# Patient Record
Sex: Male | Born: 1960 | Race: White | Hispanic: No | Marital: Married | Smoking: Never smoker
Health system: Southern US, Community
[De-identification: ages and names within clinical notes are randomized; demographics above are authoritative.]

## PROBLEM LIST (undated history)

## (undated) DIAGNOSIS — I10 Essential (primary) hypertension: Secondary | ICD-10-CM

## (undated) DIAGNOSIS — M1990 Unspecified osteoarthritis, unspecified site: Secondary | ICD-10-CM

## (undated) DIAGNOSIS — R079 Chest pain, unspecified: Secondary | ICD-10-CM

## (undated) DIAGNOSIS — E785 Hyperlipidemia, unspecified: Secondary | ICD-10-CM

## (undated) DIAGNOSIS — F419 Anxiety disorder, unspecified: Secondary | ICD-10-CM

## (undated) HISTORY — DX: Hyperlipidemia, unspecified: E78.5

## (undated) HISTORY — DX: Anxiety disorder, unspecified: F41.9

## (undated) HISTORY — PX: OTHER SURGICAL HISTORY: SHX169

## (undated) HISTORY — DX: Essential (primary) hypertension: I10

## (undated) HISTORY — DX: Chest pain, unspecified: R07.9

---

## 2006-07-12 ENCOUNTER — Ambulatory Visit: Payer: Self-pay | Admitting: Family Medicine

## 2006-08-14 ENCOUNTER — Ambulatory Visit: Payer: Self-pay | Admitting: Family Medicine

## 2014-08-11 NOTE — H&P (Signed)
  NTS SOAP Note  Vital Signs:  Vitals as of: 10/02/6965: Systolic 893: Diastolic 86: Heart Rate 56: Temp 98.93F: Height 8ft 11in: Weight 198Lbs 0 Ounces: BMI 27.62  BMI : 27.62 kg/m2  Subjective: This 54 year old male presents for of need for screening TCS.  Never has had one.  No gi complaints.  No family h/o colon cancer.  Review of Symptoms:  Constitutional:unremarkable   Head:unremarkable Eyes:blurred vision bilateral sinus problems Cardiovascular:  unremarkable Respiratory:unremarkable Gastrointestinal:  unremarkable   Genitourinary:unremarkable   back pain Skin:unremarkable Hematolgic/Lymphatic:unremarkable   Allergic/Immunologic:unremarkable   Past Medical History:  Reviewed  Past Medical History  Surgical History: right hand surgery Medical Problems: HTN Allergies: alaskan pollack Medications: alprazolam,  lisinopril   Social History:Reviewed  Social History  Preferred Language: English Race:  White Ethnicity: Not Hispanic / Latino Age: 40 year Marital Status:  L Alcohol: socially   Smoking Status: Never smoker reviewed on 07/25/2014 Functional Status reviewed on 07/25/2014 ------------------------------------------------ Bathing: Normal Cooking: Normal Dressing: Normal Driving: Normal Eating: Normal Managing Meds: Normal Oral Care: Normal Shopping: Normal Toileting: Normal Transferring: Normal Walking: Normal Cognitive Status reviewed on 07/25/2014 ------------------------------------------------ Attention: Normal Decision Making: Normal Language: Normal Memory: Normal Motor: Normal Perception: Normal Problem Solving: Normal Visual and Spatial: Normal   Family History:Reviewed  Family Health History Mother, Living; Stroke (CVA);  Father, Living; Chronic obstructive lung disease (COPD);     Objective Information: General:Well appearing, well nourished in no distress. Heart:RRR, no murmur Lungs:  CTA  bilaterally, no wheezes, rhonchi, rales.  Breathing unlabored. Abdomen:Soft, NT/ND, no HSM, no masses. deferred to procedure  Assessment:Need for screening TCS  Diagnoses: 553.21  K43.2 Recurrent ventral incisional hernia (Incisional hernia without obstruction or gangrene)  Procedures: 81017 - OFFICE OUTPATIENT NEW 20 MINUTES    Plan:  Scheduled for TCS on 09/09/14.   Patient Education:Alternative treatments to surgery were discussed with patient (and family).  Risks and benefits  of procedure including bleeding and perforation were fully explained to the patient (and family) who gave informed consent. Patient/family questions were addressed.  Follow-up:Pending Surgery

## 2014-09-09 ENCOUNTER — Ambulatory Visit (HOSPITAL_COMMUNITY)
Admission: RE | Admit: 2014-09-09 | Discharge: 2014-09-09 | Disposition: A | Discharge status: Home / Self Care | Payer: BLUE CROSS/BLUE SHIELD | Source: Ambulatory Visit | Attending: General Surgery | Admitting: General Surgery

## 2014-09-09 ENCOUNTER — Encounter (HOSPITAL_COMMUNITY)
Admission: RE | Disposition: A | Discharge status: Home / Self Care | Payer: Self-pay | Source: Ambulatory Visit | Attending: General Surgery

## 2014-09-09 DIAGNOSIS — K432 Incisional hernia without obstruction or gangrene: Secondary | ICD-10-CM | POA: Insufficient documentation

## 2014-09-09 DIAGNOSIS — I10 Essential (primary) hypertension: Secondary | ICD-10-CM | POA: Insufficient documentation

## 2014-09-09 DIAGNOSIS — K620 Anal polyp: Secondary | ICD-10-CM | POA: Diagnosis not present

## 2014-09-09 DIAGNOSIS — Z1211 Encounter for screening for malignant neoplasm of colon: Secondary | ICD-10-CM | POA: Diagnosis not present

## 2014-09-09 DIAGNOSIS — D125 Benign neoplasm of sigmoid colon: Secondary | ICD-10-CM | POA: Diagnosis not present

## 2014-09-09 DIAGNOSIS — Z79899 Other long term (current) drug therapy: Secondary | ICD-10-CM | POA: Diagnosis not present

## 2014-09-09 HISTORY — PX: COLONOSCOPY: SHX5424

## 2014-09-09 SURGERY — COLONOSCOPY
Anesthesia: Moderate Sedation

## 2014-09-09 MED ORDER — SODIUM CHLORIDE 0.9 % IV SOLN
INTRAVENOUS | Status: DC
  Administered 2014-09-09: 1000 mL via INTRAVENOUS

## 2014-09-09 MED ORDER — MEPERIDINE HCL 50 MG/ML IJ SOLN
INTRAMUSCULAR | Status: DC | PRN
  Administered 2014-09-09: 50 mg via INTRAVENOUS

## 2014-09-09 MED ORDER — MIDAZOLAM HCL 5 MG/5ML IJ SOLN
INTRAMUSCULAR | Status: DC | PRN
  Administered 2014-09-09: 3 mg via INTRAVENOUS
  Administered 2014-09-09: 1 mg via INTRAVENOUS

## 2014-09-09 MED ORDER — MIDAZOLAM HCL 5 MG/5ML IJ SOLN
INTRAMUSCULAR | Status: AC
  Filled 2014-09-09: qty 10

## 2014-09-09 MED ORDER — MEPERIDINE HCL 50 MG/ML IJ SOLN
INTRAMUSCULAR | Status: DC
Start: 2014-09-09 — End: 2014-09-09
  Filled 2014-09-09: qty 1

## 2014-09-09 MED ORDER — STERILE WATER FOR IRRIGATION IR SOLN
Status: DC | PRN
  Administered 2014-09-09: 09:00:00

## 2014-09-09 NOTE — Op Note (Signed)
[facility] [address] [place], [address]   COLONOSCOPY PROCEDURE REPORT     EXAM DATE: 2014/10/03  PATIENT NAME:      Carl Erickson, Carl Erickson           MR #:      [id number]  BIRTHDATE:       1960-08-21      VISIT #:     [id number]  ATTENDING:     Aviva Signs, MD     STATUS:     outpatient ASSISTANT:  INDICATIONS:  The patient is a 54 yr old male here for a colonoscopy due to average risk patient for colon cancer. PROCEDURE PERFORMED:     Colonoscopy with snare polypectomy MEDICATIONS:     Demerol 50 mg IV and Versed 5 mg IV ESTIMATED BLOOD LOSS:     None  CONSENT: The patient understands the risks and benefits of the procedure and understands that these risks include, but are not limited to: sedation, allergic reaction, infection, perforation and/or bleeding. Alternative means of evaluation and treatment include, among others: physical exam, x-rays, and/or surgical intervention. The patient elects to proceed with this endoscopic procedure.  DESCRIPTION OF PROCEDURE: During intra-op preparation period all mechanical & medical equipment was checked for proper function. Hand hygiene and appropriate measures for infection prevention was taken. After the risks, benefits and alternatives of the procedure were thoroughly explained, Informed consent was verified, confirmed and timeout was successfully executed by the treatment team. A digital exam revealed no abnormalities of the rectum. The EC-[id number] ([id number]) endoscope was introduced through the anus and advanced to the cecum, which was identified by both the appendix and ileocecal valve. adequate (Trilyte was used) The instrument was then slowly withdrawn as the colon was fully examined.Estimated blood loss is zero unless otherwise noted in this procedure report.   COLON FINDINGS: Two pedunculated polyps ranging between 3-18mm in size with friable surfaces were found in the distal sigmoid colon, 20 and 25cm  from the anus.  Polypectomies were performed using snare cautery.  The resection was complete, the polyp tissue was completely retrieved and sent to histology. Rest of colon otherwise unremarkable.  Retroflexed views revealed no abnormalities. The scope was then completely withdrawn from the patient and the procedure terminated. SCOPE WITHDRAWAL TIME: 11 min    ADVERSE EVENTS:      There were no immediate complications.  IMPRESSIONS:     Two pedunculated polyps ranging between 3-83mm in size were found in the distal sigmoid colon; polypectomies were performed using snare cautery  RECOMMENDATIONS:     1.  Await pathology results 2.  Repeat Colonoscopy in 3 years. RECALL:  _____________________________ Aviva Signs, MD eSigned:  Aviva Signs, MD Oct 03, 2014 8:57 AM   cc:   CPT CODES: ICD CODES:  The ICD and CPT codes recommended by this software are interpretations from the data that the clinical staff has captured with the software.  The verification of the translation of this report to the ICD and CPT codes and modifiers is the sole responsibility of the health care institution and practicing physician where this report was generated.  [employer] will not be held responsible for the validity of the ICD and CPT codes included on this report.  AMA assumes no liability for data contained or not contained herein. CPT is a Designer, television/film set of the [employer].   PATIENT NAME:  Carl Erickson, Carl Erickson MR#: [id number]

## 2014-09-09 NOTE — Discharge Instructions (Signed)

## 2014-09-09 NOTE — Interval H&P Note (Signed)
History and Physical Interval Note:  09/09/2014 8:32 AM  Carl Erickson  has presented today for surgery, with the diagnosis of screening  The various methods of treatment have been discussed with the patient and family. After consideration of risks, benefits and other options for treatment, the patient has consented to  Procedure(s): COLONOSCOPY (N/A) as a surgical intervention .  The patient's history has been reviewed, patient examined, no change in status, stable for surgery.  I have reviewed the patient's chart and labs.  Questions were answered to the patient's satisfaction.     Aviva Signs A

## 2014-09-10 ENCOUNTER — Encounter (HOSPITAL_COMMUNITY): Payer: Self-pay | Admitting: General Surgery

## 2016-06-23 ENCOUNTER — Ambulatory Visit (INDEPENDENT_AMBULATORY_CARE_PROVIDER_SITE_OTHER): Payer: BLUE CROSS/BLUE SHIELD | Admitting: Otolaryngology

## 2016-06-23 DIAGNOSIS — T783XXA Angioneurotic edema, initial encounter: Secondary | ICD-10-CM

## 2016-06-23 DIAGNOSIS — R220 Localized swelling, mass and lump, head: Secondary | ICD-10-CM | POA: Diagnosis not present

## 2016-06-23 DIAGNOSIS — R070 Pain in throat: Secondary | ICD-10-CM | POA: Diagnosis not present

## 2016-06-27 ENCOUNTER — Ambulatory Visit (INDEPENDENT_AMBULATORY_CARE_PROVIDER_SITE_OTHER): Payer: BLUE CROSS/BLUE SHIELD | Admitting: Otolaryngology

## 2016-06-27 DIAGNOSIS — T783XXA Angioneurotic edema, initial encounter: Secondary | ICD-10-CM | POA: Diagnosis not present

## 2017-07-06 ENCOUNTER — Ambulatory Visit (INDEPENDENT_AMBULATORY_CARE_PROVIDER_SITE_OTHER): Payer: Managed Care, Other (non HMO) | Admitting: Cardiovascular Disease

## 2017-07-06 ENCOUNTER — Encounter: Payer: Self-pay | Admitting: Cardiovascular Disease

## 2017-07-06 ENCOUNTER — Other Ambulatory Visit: Payer: Self-pay

## 2017-07-06 VITALS — BP 133/83 | HR 60 | Ht 71.0 in | Wt 196.0 lb

## 2017-07-06 DIAGNOSIS — F419 Anxiety disorder, unspecified: Secondary | ICD-10-CM

## 2017-07-06 DIAGNOSIS — I10 Essential (primary) hypertension: Secondary | ICD-10-CM | POA: Diagnosis not present

## 2017-07-06 DIAGNOSIS — R079 Chest pain, unspecified: Secondary | ICD-10-CM | POA: Diagnosis not present

## 2017-07-06 NOTE — Progress Notes (Signed)
CARDIOLOGY CONSULT NOTE  Patient ID: Carl Erickson MRN: [id number] DOB/AGE: April 14, 1960 57 y.o.  Admit date: (Not on file) Primary Physician: Ginger Organ Referring Physician: Ginger Organ  Reason for Consultation: Chest pain  HPI: Carl Erickson is a 57 y.o. male who is being seen today for the evaluation of chest pain at the request of Cory Munch, PA-C.   I reviewed records from his PCP.  Past medical history includes hypertension.  I personally reviewed an ECG performed on 06/08/17 which demonstrated normal sinus rhythm with no ischemic ST segment abnormalities, nor any arrhythmias.  There were nonspecific T wave abnormalities in leads III and aVF.  He said there was a lot of stress at work and things used to make him anxious and stressed out at work.  He experienced 15-20 minutes of retrosternal chest pain.  His coworkers had him sit down.  Symptoms resolved on their own.  He had some associated lightheadedness and dizziness.  He denies shortness of breath, palpitations, leg swelling, orthopnea, and syncope.  He went back to his PCP who increased amlodipine from 2.5 mg up to 5 mg.  He also placed him on fluoxetine 10 mg daily.  He is now feeling much better and denies chest pain altogether.  He never had any exertional symptoms prior to this.  This was an isolated instance.   Allergies  Allergen Reactions  . Ace Inhibitors Anaphylaxis    Current Outpatient Medications  Medication Sig Dispense Refill  . ALPRAZolam (XANAX) 1 MG tablet Take 1 mg by mouth at bedtime as needed for anxiety.    Marland Kitchen amLODipine (NORVASC) 5 MG tablet Take 5 mg by mouth daily.    Marland Kitchen FLUoxetine (PROZAC) 10 MG tablet Take 10 mg by mouth daily.    . Multiple Vitamins-Minerals (CENTRUM ULTRA MENS PO) Take 1 tablet by mouth daily.    Marland Kitchen aspirin EC 81 MG tablet Take 81 mg by mouth daily.     No current facility-administered medications for this visit.     Past Medical History:   Diagnosis Date  . Anxiety   . Chest pain   . Essential hypertension   . Hyperlipidemia     Past Surgical History:  Procedure Laterality Date  . COLONOSCOPY N/A 09/09/2014   Procedure: COLONOSCOPY;  Surgeon: Aviva Signs Md, MD;  Location: [facility];  Service: Gastroenterology;  Laterality: N/A;  . Repair on Right Hand      Social History   Socioeconomic History  . Marital status: Significant Other    Spouse name: Not on file  . Number of children: Not on file  . Years of education: Not on file  . Highest education level: Not on file  Occupational History  . Not on file  Social Needs  . Financial resource strain: Not on file  . Food insecurity:    Worry: Not on file    Inability: Not on file  . Transportation needs:    Medical: Not on file    Non-medical: Not on file  Tobacco Use  . Smoking status: Never Smoker  . Smokeless tobacco: Never Used  Substance and Sexual Activity  . Alcohol use: Not on file  . Drug use: Not on file  . Sexual activity: Not on file  Lifestyle  . Physical activity:    Days per week: Not on file    Minutes per session: Not on file  . Stress: Not on file  Relationships  .  Social connections:    Talks on phone: Not on file    Gets together: Not on file    Attends religious service: Not on file    Active member of club or organization: Not on file    Attends meetings of clubs or organizations: Not on file    Relationship status: Not on file  . Intimate partner violence:    Fear of current or ex partner: Not on file    Emotionally abused: Not on file    Physically abused: Not on file    Forced sexual activity: Not on file  Other Topics Concern  . Not on file  Social History Narrative  . Not on file     No family history of premature CAD in 1st degree relatives.  Current Meds  Medication Sig  . ALPRAZolam (XANAX) 1 MG tablet Take 1 mg by mouth at bedtime as needed for anxiety.  Marland Kitchen amLODipine (NORVASC) 5 MG tablet Take 5 mg by  mouth daily.  Marland Kitchen FLUoxetine (PROZAC) 10 MG tablet Take 10 mg by mouth daily.  . Multiple Vitamins-Minerals (CENTRUM ULTRA MENS PO) Take 1 tablet by mouth daily.      Review of systems complete and found to be negative unless listed above in HPI    Physical exam Blood pressure 133/83, pulse 60, height 5\' 11"  (1.803 m), weight 196 lb (88.9 kg), SpO2 97 %. General: NAD Neck: No JVD, no thyromegaly or thyroid nodule.  Lungs: Clear to auscultation bilaterally with normal respiratory effort. CV: Nondisplaced PMI. Regular rate and rhythm, normal S1/S2, no S3/S4, no murmur.  No peripheral edema.  No carotid bruit.  Abdomen: Soft, nontender, no distention.  Skin: Intact without lesions or rashes.  Neurologic: Alert and oriented x 3.  Psych: Normal affect. Extremities: No clubbing or cyanosis.  HEENT: Normal.   ECG: Most recent ECG reviewed.   Labs: No results found for: K, BUN, CREATININE, ALT, TSH, HGB   Lipids: No results found for: LDLCALC, LDLDIRECT, CHOL, TRIG, HDL      ASSESSMENT AND PLAN:  1.  Chest pain: This was an isolated instance and he has had no recurrence of symptoms.  He had no exertional chest discomfort or shortness of breath prior to this.  Symptoms may have been exacerbated by hypertension and anxiety and stress.  I told him if he were to have symptom recurrence in spite of good blood pressure control, I would obtain a stress test.  2.  Hypertension: Blood pressure is normal.  No changes to therapy.  3.  Anxiety and stress: Marked symptom medic improvement with fluoxetine as per patient and his wife.     Disposition: Follow up prn  Signed: Kate Sable, M.D., F.A.C.C.  07/06/2017, 1:49 PM

## 2017-07-06 NOTE — Patient Instructions (Signed)
Your physician recommends that you schedule a follow-up appointment in: AS NEEDED WITH DR KONESWARAN  Your physician recommends that you continue on your current medications as directed. Please refer to the Current Medication list given to you today.  Thank you for choosing [facility]!!    

## 2017-07-07 ENCOUNTER — Encounter: Payer: Self-pay | Admitting: *Deleted

## 2020-11-25 ENCOUNTER — Other Ambulatory Visit: Payer: Self-pay

## 2020-11-25 ENCOUNTER — Ambulatory Visit: Payer: Self-pay

## 2020-11-25 ENCOUNTER — Other Ambulatory Visit: Payer: Self-pay | Admitting: Occupational Medicine

## 2020-11-25 DIAGNOSIS — Z0000 Encounter for general adult medical examination without abnormal findings: Secondary | ICD-10-CM

## 2021-03-18 ENCOUNTER — Ambulatory Visit: Payer: 59

## 2021-03-18 ENCOUNTER — Ambulatory Visit: Payer: 59 | Admitting: Orthopedic Surgery

## 2021-03-18 ENCOUNTER — Other Ambulatory Visit: Payer: Self-pay

## 2021-03-18 VITALS — BP 133/83 | HR 65

## 2021-03-18 DIAGNOSIS — G8929 Other chronic pain: Secondary | ICD-10-CM

## 2021-03-18 DIAGNOSIS — S83242A Other tear of medial meniscus, current injury, left knee, initial encounter: Secondary | ICD-10-CM | POA: Diagnosis not present

## 2021-03-18 DIAGNOSIS — M25562 Pain in left knee: Secondary | ICD-10-CM

## 2021-03-18 DIAGNOSIS — M25561 Pain in right knee: Secondary | ICD-10-CM

## 2021-03-18 DIAGNOSIS — M170 Bilateral primary osteoarthritis of knee: Secondary | ICD-10-CM

## 2021-03-18 DIAGNOSIS — M2352 Chronic instability of knee, left knee: Secondary | ICD-10-CM | POA: Diagnosis not present

## 2021-03-18 MED ORDER — MELOXICAM 7.5 MG PO TABS: 7.5000 mg | ORAL_TABLET | Freq: Every day | ORAL | 5 refills | Status: DC

## 2021-03-18 NOTE — Patient Instructions (Signed)
While we are working on your approval for MRI please go ahead and call to schedule your appointment with [facility] within at least one (1) week.   Central Scheduling [phone]  

## 2021-03-18 NOTE — Progress Notes (Signed)
EVALUATION AND MANAGEMENT   Type of appointment : New patient evaluation  PLAN: Recommend MRI of the left knee  Start NSAIDs with meloxicam  Inject left knee  Procedure left knee injection  Patient gave consent for left knee injection and the site was confirmed with the patient by timeout  Medication Depo-Medrol 40 mg and lidocaine 1% 4 cc Skin prepped with alcohol and ethyl chloride Knee was injected with 20-gauge needle through a lateral approach with a flexed at 90 degrees No complications were seen  Meds ordered this encounter  Medications   meloxicam (MOBIC) 7.5 MG tablet    Sig: Take 1 tablet (7.5 mg total) by mouth daily.    Dispense:  30 tablet    Refill:  5     Chief Complaint  Patient presents with   Knee Pain    Bilat knee pain for approx 7 mos. Starter when he was playing golf and felt a pop in Lt knee. Lt > Rt. Instability in Lt knee going up stairs.    60 year old male works at [employer] injured his left knee in May of this year playing golf.  When he swung through as a right-handed golfer he felt a pop and pain on the medial joint line of his left knee  Since that time as he is dependent more of his right knee the left and right knee now hurt  He tried ibuprofen  He saw primary care  He is having a lot of difficulty because he walks a lot at work has to climb steps and his knee is not getting any better  His symptoms now include pain swelling popping difficulty walking   Review of Systems  All other systems reviewed and are negative.     Physical Exam Constitutional:      General: He is not in acute distress.    Appearance: He is well-developed.     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  Neurological:     Mental Status: He is alert and oriented to person, place, and time.     Sensory: No sensory deficit.     Coordination: Coordination normal.     Gait: Gait normal.     Deep Tendon Reflexes: Reflexes are normal and symmetric.  Psychiatric:        Mood and Affect: Mood normal.        Behavior: Behavior normal.        Thought Content: Thought content normal.        Judgment: Judgment normal.     Comments: Affect normal     Past Medical History:  Diagnosis Date   Anxiety    Chest pain    Essential hypertension    Hyperlipidemia    Past Surgical History:  Procedure Laterality Date   COLONOSCOPY N/A 09/09/2014   Procedure: COLONOSCOPY;  Surgeon: Aviva Signs Md, MD;  Location: [facility];  Service: Gastroenterology;  Laterality: N/A;   Repair on Right Hand     Social History   Tobacco Use   Smoking status: Never   Smokeless tobacco: Never     Assessment and Plan:  Encounter Diagnoses  Name Primary?   Bilateral chronic knee pain Yes   Recurrent left knee instability    Acute medial meniscus tear of left knee, initial encounter  Chief Complaint  Patient presents with   Knee Pain    Bilat knee pain for approx 7 mos. Starter when he was playing golf and felt a pop in Lt knee. Lt > Rt. Instability in Lt knee going up stairs.

## 2021-04-06 ENCOUNTER — Other Ambulatory Visit: Payer: Self-pay

## 2021-04-06 ENCOUNTER — Ambulatory Visit (HOSPITAL_COMMUNITY)
Admission: RE | Admit: 2021-04-06 | Discharge: 2021-04-06 | Disposition: A | Discharge status: Home / Self Care | Payer: 59 | Source: Ambulatory Visit | Attending: Orthopedic Surgery | Admitting: Orthopedic Surgery

## 2021-04-06 DIAGNOSIS — M25561 Pain in right knee: Secondary | ICD-10-CM | POA: Diagnosis not present

## 2021-04-06 DIAGNOSIS — M25562 Pain in left knee: Secondary | ICD-10-CM | POA: Insufficient documentation

## 2021-04-06 DIAGNOSIS — M2352 Chronic instability of knee, left knee: Secondary | ICD-10-CM | POA: Insufficient documentation

## 2021-04-06 DIAGNOSIS — G8929 Other chronic pain: Secondary | ICD-10-CM | POA: Insufficient documentation

## 2021-04-08 ENCOUNTER — Other Ambulatory Visit: Payer: Self-pay

## 2021-04-08 ENCOUNTER — Ambulatory Visit: Payer: 59 | Admitting: Orthopedic Surgery

## 2021-04-08 DIAGNOSIS — M1712 Unilateral primary osteoarthritis, left knee: Secondary | ICD-10-CM

## 2021-04-08 DIAGNOSIS — M2352 Chronic instability of knee, left knee: Secondary | ICD-10-CM | POA: Diagnosis not present

## 2021-04-08 DIAGNOSIS — S83242D Other tear of medial meniscus, current injury, left knee, subsequent encounter: Secondary | ICD-10-CM

## 2021-04-08 DIAGNOSIS — G8929 Other chronic pain: Secondary | ICD-10-CM

## 2021-04-08 DIAGNOSIS — M1710 Unilateral primary osteoarthritis, unspecified knee: Secondary | ICD-10-CM

## 2021-04-08 DIAGNOSIS — M25561 Pain in right knee: Secondary | ICD-10-CM | POA: Diagnosis not present

## 2021-04-08 NOTE — Progress Notes (Signed)
Chief Complaint  Patient presents with   Follow-up    MRI of left knee    Encounter Diagnoses  Name Primary?   Bilateral chronic knee pain    Recurrent left knee instability    Acute medial meniscus tear of left knee, subsequent encounter Yes   Primary localized osteoarthritis of knee     Carl Erickson is here for MRI follow-up.  His MRI shows a torn medial meniscus and then grade 4 articular cartilage defects in the patellofemoral joint and medial femoral condyle  I have personally reviewed the MRI and my interpretation is that he has a torn medial meniscus with complex tear and he has grade 4 arthritis in the medial and patellofemoral joint  Time he says the brace did help but it slowed him down.  He has to climb a lot of stairs at work for [employer]  This will factor majorly into his decision making for total knee versus arthroscopy  He did receive meloxicam and an injection in the left knee  We discussed the benefits and risks of total knee versus arthroscopy  My recommendations were that if he got the arthroscopy done the medial meniscectomy would improve his knee some but he has a 60% chance of having a knee replacement within 5 years and I estimated he would be out of work for 4 weeks  If he got the knee replacement done of course the complications are much higher including pulmonary embolism and infection and 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

## 2021-04-12 ENCOUNTER — Telehealth: Payer: Self-pay | Admitting: Orthopedic Surgery

## 2021-04-12 NOTE — Telephone Encounter (Signed)
Patient called to relay that he would like to procedd with total knee replacement surgery; states looking at dates 05/17/21 or 05/25/21. Pleae call [phone] (states has same [facility] insurance)

## 2021-04-13 ENCOUNTER — Other Ambulatory Visit: Payer: Self-pay | Admitting: Orthopedic Surgery

## 2021-04-13 DIAGNOSIS — M1712 Unilateral primary osteoarthritis, left knee: Secondary | ICD-10-CM

## 2021-04-13 DIAGNOSIS — Z01818 Encounter for other preprocedural examination: Secondary | ICD-10-CM

## 2021-04-13 MED ORDER — BUPIVACAINE-MELOXICAM ER 200-6 MG/7ML IJ SOLN: 400.0000 mg | Freq: Once | INTRAMUSCULAR | Status: AC

## 2021-04-13 NOTE — Telephone Encounter (Signed)
Called patient advised him can do Feb 21st told him to expect call from [facility] regarding pre op and I will mail him an order for walker  To you FYI he has chosen to proceed with the total knee replacement

## 2021-05-11 ENCOUNTER — Telehealth: Payer: Self-pay | Admitting: Radiology

## 2021-05-11 ENCOUNTER — Other Ambulatory Visit: Payer: Self-pay | Admitting: Orthopedic Surgery

## 2021-05-11 ENCOUNTER — Encounter: Payer: Self-pay | Admitting: Orthopedic Surgery

## 2021-05-11 DIAGNOSIS — M1712 Unilateral primary osteoarthritis, left knee: Secondary | ICD-10-CM

## 2021-05-11 NOTE — Telephone Encounter (Signed)
Spoke with patient; aware note has been done with date as noted.

## 2021-05-11 NOTE — Addendum Note (Signed)
Addended byCandice Camp on: 05/11/2021 03:16 PM   Modules accepted: Orders

## 2021-05-11 NOTE — Telephone Encounter (Signed)
I called patient, there is a scheduling conflict need to RS his surgery from Feb 21st to Feb 24th instead  I told him I would let you know since there are forms that need to be addend ed

## 2021-05-17 ENCOUNTER — Ambulatory Visit (HOSPITAL_COMMUNITY): Payer: 59

## 2021-05-19 ENCOUNTER — Other Ambulatory Visit: Payer: Self-pay | Admitting: Orthopedic Surgery

## 2021-05-20 NOTE — Patient Instructions (Signed)
Carl Erickson  05/20/2021     @PREFPERIOPPHARMACY @   Your procedure is scheduled on 05/28/2021.  Report to [facility] at 6:00 A.M.  Call this number if you have problems the morning of surgery:  [phone]   Remember:  Do not eat or drink after midnight.     Take these medicines the morning of surgery with A SIP OF WATER : Xanax, Amlodipine and Lebetalol    Do not wear jewelry, make-up or nail polish.  Do not wear lotions, powders, or perfumes, or deodorant.  Do not shave 48 hours prior to surgery.  Men may shave face and neck.  Do not bring valuables to the hospital.  [facility] is not responsible for any belongings or valuables.  Contacts, dentures or bridgework may not be worn into surgery.  Leave your suitcase in the car.  After surgery it may be brought to your room.  For patients admitted to the hospital, discharge time will be determined by your treatment team.  Patients discharged the day of surgery will not be allowed to drive home.   Name and phone number of your driver:   Family  Special instructions:  N/A  Please read over the following fact sheets that you were given. Care and Recovery After Surgery  Total Knee Replacement Total knee replacement is a procedure to replace the damaged knee joint with an artificial (prosthetic) knee joint. The purpose of this surgery is to reduce knee pain and improve knee function. The prosthetic knee joint (prosthesis) may be made of metal, plastic, or ceramic. It replaces parts of the thigh bone (femur), lower leg bone (tibia), and kneecap (patella) that are removed during the procedure. Tell a health care provider about: Any allergies you have. All medicines you are taking, including vitamins, herbs, eye drops, creams, and over-the-counter medicines. Any problems you or family members have had with anesthetic medicines. Any blood disorders you have. Any surgeries you have had. Any medical conditions you have. Whether  you are pregnant or may be pregnant. What are the risks? Generally, this is a safe procedure. However, problems may occur, including: Infection. Bleeding. A blood clot that forms in your leg. The clot may break loose and travel to your lungs (pulmonary embolism). Allergic reactions to medicines. Damage to nerves or other structures. Problems with the knee, such as: Decreased range of motion of the knee. Instability of the knee. Loosening of the prosthetic joint. Knee pain that does not go away. What happens before the procedure? Staying hydrated Follow instructions from your health care provider about hydration, which may include: Up to 2 hours before the procedure - you may continue to drink clear liquids, such as water, clear fruit juice, black coffee, and plain tea.  Eating and drinking restrictions Follow instructions from your health care provider about eating and drinking, which may include: 8 hours before the procedure - stop eating heavy meals or foods, such as meat, fried foods, or fatty foods. 6 hours before the procedure - stop eating light meals or foods, such as toast or cereal. 6 hours before the procedure - stop drinking milk or drinks that contain milk. 2 hours before the procedure - stop drinking clear liquids. Medicines Ask your health care provider about: Changing or stopping your regular medicines. This is especially important if you are taking diabetes medicines or blood thinners. Taking medicines such as aspirin and ibuprofen. These medicines can thin your blood. Do not take these medicines unless your health care  provider tells you to take them. Taking over-the-counter medicines, vitamins, herbs, and supplements. Tests and exams You may have: A physical exam. Tests, such as X-rays, MRI, CT scan, or bone scan. A blood or urine sample taken. Lifestyle  Keep your body and teeth clean. Germs from anywhere in your body can travel to your new joint and infect it.  Tell your health care provider if you: Plan to have dental care and routine cleanings. Develop any skin infections. If your health care provider prescribes physical therapy, do exercises as instructed. If you are overweight, work with your health care provider to reach a safe weight. Extra weight can put pressure on your knee. Do not use any products that contain nicotine or tobacco for at least 4 weeks before the procedure. These products include cigarettes, chewing tobacco, and vaping devices, such as e-cigarettes. If you need help quitting, ask your health care provider. Surgery safety Ask your health care provider: How your surgery site will be marked. What steps will be taken to help prevent infection. These steps may include: Removing hair at the surgery site. Washing skin with a germ-killing soap. Taking antibiotic medicine. General instructions Do not shave your legs just before surgery. If hair removal is needed, it will be done in the hospital. Plan to have a responsible adult take you home from the hospital or clinic. Plan to have a responsible adult care for you for the time you are told after you leave the hospital or clinic. This is important. It is recommended that you have someone to help care for you for at least 4-6 weeks after your procedure. What happens during the procedure? An IV will be inserted into one of your veins. You will be given one or more of the following: A medicine to help you relax (sedative). A medicine that is injected into an area of your body near the nerves to numb everything below the injection site (peripheral nerve block). A medicine that is injected into your spine to numb the area below and slightly above the injection site (spinal anesthetic). A medicine to make you fall asleep (general anesthetic). An incision will be made in your knee. Damaged cartilage and bone will be removed from your femur, tibia, and patella. Parts of the prosthesis  (liners) will be placed over the areas of bone and cartilage that were removed. A metal liner will be placed over your femur, and plastic liners will be placed over your tibia and the underside of your patella. Your incision will be closed with stitches (sutures), staples, skin glue, or adhesive strips. A bandage (dressing) will be placed over your incision. The procedure may vary among health care providers and hospitals. What happens after the procedure? Your blood pressure, heart rate, breathing rate, and blood oxygen level will be monitored until you leave the hospital or clinic. You will be given medicines to help manage pain. You may: Continue to receive fluids through an IV. Have to wear compression stockings. These stockings help to prevent blood clots and reduce swelling in your legs. You will be encouraged to move. A physical therapist will teach you how to use crutches or a walker and how to exercise at home. Do not drive until your health care provider approves. Summary Total knee replacement is a procedure to replace the knee joint with an artificial knee joint. Before the procedure, follow instructions from your health care provider about eating and drinking. Plan to have a responsible adult take you home from the  hospital or clinic. This information is not intended to replace advice given to you by your health care provider. Make sure you discuss any questions you have with your health care provider. Document Revised: 09/10/2019 Document Reviewed: 09/10/2019 Elsevier Patient Education  2022 [place]. Preparing for Knee Replacement Getting prepared before knee replacement surgery can make recovery easier and more comfortable. This document provides some tips and guidelines that will help you prepare for your surgery. Talk with your health care provider so you can learn what to expect before, during, and after surgery. Ask questions if you do not understand something. Tell a  health care provider about: Any allergies you have. All medicines you are taking, including vitamins, herbs, eye drops, creams, and over-the-counter medicines. Any problems you or family members have had with anesthetic medicines. Any blood disorders you have. Any surgeries you have had. Any medical conditions you have. Whether you are pregnant or may be pregnant. What happens before the procedure? Visit your health care providers Keep all appointments before surgery. You will need to have a physical exam before surgery (preoperative exam) to make sure it is safe for you to have knee replacement surgery. You may also need to have more tests. When you go to the exam, bring a list of all the medicines and supplements, including herbs and vitamins, that you take. Have dental care and routine cleanings done before surgery. Germs from anywhere in your body, including your mouth, can travel to your new joint and infect it. Tell your dentist that you plan to have knee replacement surgery. Know the costs of surgery To find out how much the surgery will cost, call your insurance company as soon as you decide to have surgery. Ask questions like: How much of the surgery and hospital stay will be covered? What will be covered for: Medical equipment? Rehabilitation facilities? Home care? Prepare your home Pick a recovery spot that is not your bed. It is better that you sit more upright during recovery. You may want to use a recliner. Place items that you often use on a small table near your recovery spot. These may include the TV remote, a cordless phone or your mobile phone, a book, a laptop computer, and a water glass. Move other items you will need to shelves and drawers that are at countertop height. Do this in your kitchen, bathroom, and bedroom. You may be given a walker to use at home. Check if you will have enough room to use a walker. Move around your home with your hands out about 6 inches (15  cm) from your sides. You will have enough room if you do not hit anything with your hands as you do this. Walk from: Your recovery spot to your kitchen and bathroom. Your bed to the bathroom. Prepare some meals to freeze and reheat later. Make your home safe for recovery   Remove all clutter and throw rugs from your floors. This will help you avoid tripping. Consider getting safety equipment that will be helpful during your recovery, such as: Grab bars in the shower and near the toilet. A raised toilet seat. This will help you get on and off the toilet more easily. A tub or shower bench. Prepare your body If you smoke, quit as soon as you can before surgery. If there is time, it is best to quit several months before surgery. Tell your surgeon if you use any products that contain nicotine or tobacco. These products include cigarettes, chewing tobacco,  and vaping devices, such as e-cigarettes. These can delay healing. If you need help quitting, ask your health care provider. Talk to your health care provider about doing exercises before your surgery. Doing these exercises in the weeks before your surgery may help reduce pain and improve function after surgery. Be sure to follow the exercise program given by your health care provider. Maintain a healthy diet. Do not change your diet before surgery unless your health care provider tells you to do that. Do not drink any alcohol for at least 48 hours before surgery. Plan your recovery In the first couple of weeks after surgery, it will be harder for you to do some of your regular activities. You may get tired easily, and you will have limited movement in your leg. To make sure you have all the help you need after your surgery: Plan to have a responsible adult take you home from the hospital. Your health care provider will tell you how many days you can expect to be in the hospital. Cancel all work, caregiving, and volunteer responsibilities for at  least 4-6 weeks after surgery. Plan to have a responsible adult stay with you day and night for the first week. This person should be someone you are comfortable with. You may need this person to help you with your exercises and personal care, such as bathing and using the toilet. If you live alone, arrange for someone to take care of your home and pets for the first 4-6 weeks after surgery. Arrange for drivers to take you to follow-up visits, the grocery store, and other places you may need to go for at least 4-6 weeks. Consider applying for a disability parking permit. To get an application, call your local department of motor vehicles [facility]) or your health care provider's office. Summary Getting prepared before knee replacement surgery can make your recovery easier and more comfortable. Keep all visits to your health care provider before surgery. You will have an exam and may have tests to make sure that you are ready for your surgery. Prepare your home and arrange for help at home. Plan to have a responsible adult take you home from the hospital. Also, plan to have a responsible adult stay with you day and night for the first week after you leave the hospital. This information is not intended to replace advice given to you by your health care provider. Make sure you discuss any questions you have with your health care provider. Document Revised: 09/10/2019 Document Reviewed: 09/10/2019 Elsevier Patient Education  2022 [place].  How to Use Chlorhexidine for Bathing Chlorhexidine gluconate (CHG) is a germ-killing (antiseptic) solution that is used to clean the skin. It can get rid of the bacteria that normally live on the skin and can keep them away for about 24 hours. To clean your skin with CHG, you may be given: A CHG solution to use in the shower or as part of a sponge bath. A prepackaged cloth that contains CHG. Cleaning your skin with CHG may help lower the risk for infection: While  you are staying in the intensive care unit of the hospital. If you have a vascular access, such as a central line, to provide short-term or long-term access to your veins. If you have a catheter to drain urine from your bladder. If you are on a ventilator. A ventilator is a machine that helps you breathe by moving air in and out of your lungs. After surgery. What are the risks?  Risks of using CHG include: A skin reaction. Hearing loss, if CHG gets in your ears and you have a perforated eardrum. Eye injury, if CHG gets in your eyes and is not rinsed out. The CHG product catching fire. Make sure that you avoid smoking and flames after applying CHG to your skin. Do not use CHG: If you have a chlorhexidine allergy or have previously reacted to chlorhexidine. On babies younger than 37 months of age. How to use CHG solution Use CHG only as told by your health care provider, and follow the instructions on the label. Use the full amount of CHG as directed. Usually, this is one bottle. During a shower Follow these steps when using CHG solution during a shower (unless your health care provider gives you different instructions): Start the shower. Use your normal soap and shampoo to wash your face and hair. Turn off the shower or move out of the shower stream. Pour the CHG onto a clean washcloth. Do not use any type of brush or rough-edged sponge. Starting at your neck, lather your body down to your toes. Make sure you follow these instructions: If you will be having surgery, pay special attention to the part of your body where you will be having surgery. Scrub this area for at least 1 minute. Do not use CHG on your head or face. If the solution gets into your ears or eyes, rinse them well with water. Avoid your genital area. Avoid any areas of skin that have broken skin, cuts, or scrapes. Scrub your back and under your arms. Make sure to wash skin folds. Let the lather sit on your skin for 1-2  minutes or as long as told by your health care provider. Thoroughly rinse your entire body in the shower. Make sure that all body creases and crevices are rinsed well. Dry off with a clean towel. Do not put any substances on your body afterward--such as powder, lotion, or perfume--unless you are told to do so by your health care provider. Only use lotions that are recommended by the manufacturer. Put on clean clothes or pajamas. If it is the night before your surgery, sleep in clean sheets.  During a sponge bath Follow these steps when using CHG solution during a sponge bath (unless your health care provider gives you different instructions): Use your normal soap and shampoo to wash your face and hair. Pour the CHG onto a clean washcloth. Starting at your neck, lather your body down to your toes. Make sure you follow these instructions: If you will be having surgery, pay special attention to the part of your body where you will be having surgery. Scrub this area for at least 1 minute. Do not use CHG on your head or face. If the solution gets into your ears or eyes, rinse them well with water. Avoid your genital area. Avoid any areas of skin that have broken skin, cuts, or scrapes. Scrub your back and under your arms. Make sure to wash skin folds. Let the lather sit on your skin for 1-2 minutes or as long as told by your health care provider. Using a different clean, wet washcloth, thoroughly rinse your entire body. Make sure that all body creases and crevices are rinsed well. Dry off with a clean towel. Do not put any substances on your body afterward--such as powder, lotion, or perfume--unless you are told to do so by your health care provider. Only use lotions that are recommended by the manufacturer. Put on  clean clothes or pajamas. If it is the night before your surgery, sleep in clean sheets. How to use CHG prepackaged cloths Only use CHG cloths as told by your health care provider, and  follow the instructions on the label. Use the CHG cloth on clean, dry skin. Do not use the CHG cloth on your head or face unless your health care provider tells you to. When washing with the CHG cloth: Avoid your genital area. Avoid any areas of skin that have broken skin, cuts, or scrapes. Before surgery Follow these steps when using a CHG cloth to clean before surgery (unless your health care provider gives you different instructions): Using the CHG cloth, vigorously scrub the part of your body where you will be having surgery. Scrub using a back-and-forth motion for 3 minutes. The area on your body should be completely wet with CHG when you are done scrubbing. Do not rinse. Discard the cloth and let the area air-dry. Do not put any substances on the area afterward, such as powder, lotion, or perfume. Put on clean clothes or pajamas. If it is the night before your surgery, sleep in clean sheets.  For general bathing Follow these steps when using CHG cloths for general bathing (unless your health care provider gives you different instructions). Use a separate CHG cloth for each area of your body. Make sure you wash between any folds of skin and between your fingers and toes. Wash your body in the following order, switching to a new cloth after each step: The front of your neck, shoulders, and chest. Both of your arms, under your arms, and your hands. Your stomach and groin area, avoiding the genitals. Your right leg and foot. Your left leg and foot. The back of your neck, your back, and your buttocks. Do not rinse. Discard the cloth and let the area air-dry. Do not put any substances on your body afterward--such as powder, lotion, or perfume--unless you are told to do so by your health care provider. Only use lotions that are recommended by the manufacturer. Put on clean clothes or pajamas. Contact a health care provider if: Your skin gets irritated after scrubbing. You have questions  about using your solution or cloth. You swallow any chlorhexidine. Call your local poison control center (1-306-700-1420 in the [place] Get help right away if: Your eyes itch badly, or they become very red or swollen. Your skin itches badly and is red or swollen. Your hearing changes. You have trouble seeing. You have swelling or tingling in your mouth or throat. You have trouble breathing. These symptoms may represent a serious problem that is an emergency. Do not wait to see if the symptoms will go away. Get medical help right away. Call your local emergency services (911 in the [place] Do not drive yourself to the hospital. Summary Chlorhexidine gluconate (CHG) is a germ-killing (antiseptic) solution that is used to clean the skin. Cleaning your skin with CHG may help to lower your risk for infection. You may be given CHG to use for bathing. It may be in a bottle or in a prepackaged cloth to use on your skin. Carefully follow your health care provider's instructions and the instructions on the product label. Do not use CHG if you have a chlorhexidine allergy. Contact your health care provider if your skin gets irritated after scrubbing. This information is not intended to replace advice given to you by your health care provider. Make sure you discuss any questions you have with your  health care provider. Document Revised: 06/01/2020 Document Reviewed: 06/01/2020 Elsevier Patient Education  2022 [employer].

## 2021-05-21 ENCOUNTER — Other Ambulatory Visit: Payer: Self-pay

## 2021-05-21 ENCOUNTER — Other Ambulatory Visit (HOSPITAL_COMMUNITY)
Admission: RE | Admit: 2021-05-21 | Discharge: 2021-05-21 | Disposition: A | Discharge status: Home / Self Care | Payer: 59 | Source: Ambulatory Visit | Attending: Orthopedic Surgery | Admitting: Orthopedic Surgery

## 2021-05-21 ENCOUNTER — Encounter (HOSPITAL_COMMUNITY): Payer: Self-pay

## 2021-05-21 ENCOUNTER — Telehealth: Payer: Self-pay | Admitting: Orthopedic Surgery

## 2021-05-21 ENCOUNTER — Encounter (HOSPITAL_COMMUNITY)
Admission: RE | Admit: 2021-05-21 | Discharge: 2021-05-21 | Disposition: A | Discharge status: Home / Self Care | Payer: 59 | Source: Ambulatory Visit | Attending: Orthopedic Surgery | Admitting: Orthopedic Surgery

## 2021-05-21 DIAGNOSIS — Z01818 Encounter for other preprocedural examination: Secondary | ICD-10-CM | POA: Insufficient documentation

## 2021-05-21 DIAGNOSIS — M1712 Unilateral primary osteoarthritis, left knee: Secondary | ICD-10-CM | POA: Diagnosis not present

## 2021-05-21 DIAGNOSIS — Z20822 Contact with and (suspected) exposure to covid-19: Secondary | ICD-10-CM | POA: Insufficient documentation

## 2021-05-21 DIAGNOSIS — Z01812 Encounter for preprocedural laboratory examination: Secondary | ICD-10-CM | POA: Insufficient documentation

## 2021-05-21 LAB — BASIC METABOLIC PANEL
Anion gap: 10 (ref 5–15)
BUN: 17 mg/dL (ref 6–20)
CO2: 22 mmol/L (ref 22–32)
Calcium: 9.3 mg/dL (ref 8.9–10.3)
Chloride: 102 mmol/L (ref 98–111)
Creatinine, Ser: 0.79 mg/dL (ref 0.61–1.24)
GFR, Estimated: >60 mL/min (ref >60)
Glucose, Bld: 92 mg/dL (ref 70–99)
Potassium: 3.3 mmol/L — ABNORMAL LOW (ref 3.5–5.1)
Sodium: 134 mmol/L — ABNORMAL LOW (ref 135–145)

## 2021-05-21 LAB — TYPE AND SCREEN
ABO/RH(D): A POS
Antibody Screen: NEGATIVE

## 2021-05-21 LAB — CBC WITH DIFFERENTIAL/PLATELET
Abs Immature Granulocytes: 0.01 10*3/uL (ref 0.00–0.07)
Basophils Absolute: 0.1 10*3/uL (ref 0.0–0.1)
Basophils Relative: 1 %
Eosinophils Absolute: 0.1 10*3/uL (ref 0.0–0.5)
Eosinophils Relative: 2 %
HCT: 37.6 % — ABNORMAL LOW (ref 39.0–52.0)
Hemoglobin: 12.6 g/dL — ABNORMAL LOW (ref 13.0–17.0)
Immature Granulocytes: 0 %
Lymphocytes Relative: 38 %
Lymphs Abs: 2.8 10*3/uL (ref 0.7–4.0)
MCH: 31.3 pg (ref 26.0–34.0)
MCHC: 33.5 g/dL (ref 30.0–36.0)
MCV: 93.5 fL (ref 80.0–100.0)
Monocytes Absolute: 0.9 10*3/uL (ref 0.1–1.0)
Monocytes Relative: 12 %
Neutro Abs: 3.6 10*3/uL (ref 1.7–7.7)
Neutrophils Relative %: 47 %
Platelets: 269 10*3/uL (ref 150–400)
RBC: 4.02 MIL/uL — ABNORMAL LOW (ref 4.22–5.81)
RDW: 12.5 % (ref 11.5–15.5)
WBC: 7.4 10*3/uL (ref 4.0–10.5)
nRBC: 0 % (ref 0.0–0.2)

## 2021-05-21 LAB — SARS CORONAVIRUS 2 (TAT 6-24 HRS): SARS Coronavirus 2: NEGATIVE

## 2021-05-21 NOTE — Telephone Encounter (Signed)
Patient spouse called and left a voicemail  states the patient is taking 800 mg Ibuprofen and it needs to be added to his medication list   Please call her back at [phone]

## 2021-05-21 NOTE — Telephone Encounter (Signed)
Medication added to patients med list. They are aware.

## 2021-05-27 ENCOUNTER — Telehealth: Payer: Self-pay | Admitting: Radiology

## 2021-05-27 ENCOUNTER — Other Ambulatory Visit: Payer: Self-pay | Admitting: Orthopedic Surgery

## 2021-05-27 MED ORDER — POTASSIUM CHLORIDE CRYS ER 20 MEQ PO TBCR: 20.0000 meq | EXTENDED_RELEASE_TABLET | Freq: Three times a day (TID) | ORAL | 0 refills | Status: DC

## 2021-05-27 NOTE — Telephone Encounter (Signed)
Advised him K+ has been sent in for him and he can pick up today he voiced understanding

## 2021-05-27 NOTE — Telephone Encounter (Signed)
[facility] Pre-Op called, Dr Charna Elizabeth wants patient to take a Potassium supplement.  Potassium was 3.3.  Surgery tomorrow.

## 2021-05-27 NOTE — OR Nursing (Signed)
Potassium 3.3 reported to Dr. Wyatt Haste . Orders given to notify Dr. Aline Brochure. Per Dr. Wyatt Haste patient needs to be put on K supplement.Marland Kitchen

## 2021-05-27 NOTE — OR Nursing (Signed)
Spoke with Carl Erickson at the office about Potassium level and Dr. Wyatt Haste request. She stated she will let Dr. Aline Brochure know.

## 2021-05-27 NOTE — H&P (Signed)
TOTAL KNEE ADMISSION H&P  Patient is being admitted for left total knee arthroplasty.  Subjective:  Chief Complaint:left knee pain.  Patient presents for left total knee.  Is a 61 year old male works at [employer] injured his knee playing golf last Monday.  He felt a pain and pop on his swing through and started having medial joint line pain  Since that time he depended more on his right knee than his left we did try some nonoperative measures such as ibuprofen meloxicam knee injection and eventually had an MRI  His MRI eventually showed that he had a torn meniscus but he also had high-grade patellofemoral and medial compartment cartilage loss and he was not satisfied with the option of a arthroscopy with further surgery potentially down the road and opted for a left total knee arthroplasty  His activity of daily living have definitely become more difficult and he has to climb a lot of steps at work which is concerning him as is the popping and difficulty ambulating   Allergies  Allergen Reactions   Ace Inhibitors Anaphylaxis     Past Medical History:  Diagnosis Date   Anxiety    Chest pain    Essential hypertension    Hyperlipidemia     Past Surgical History:  Procedure Laterality Date   COLONOSCOPY N/A 09/09/2014   Procedure: COLONOSCOPY;  Surgeon: Aviva Signs Md, MD;  Location: [facility];  Service: Gastroenterology;  Laterality: N/A;   Repair on Right Hand      Current Facility-Administered Medications  Medication Dose Route Frequency Provider Last Rate Last Admin   bupivacaine-meloxicam ER (ZYNRELEF) injection 400 mg  400 mg Infiltration Once Carole Civil, MD       Current Outpatient Medications  Medication Sig Dispense Refill Last Dose   ALPRAZolam (XANAX) 1 MG tablet Take 0.5-1 mg by mouth 2 (two) times daily as needed for anxiety.      amLODipine (NORVASC) 10 MG tablet Take 10 mg by mouth daily.      hydrochlorothiazide (HYDRODIURIL) 12.5 MG tablet  Take 12.5 mg by mouth daily.      labetalol (NORMODYNE) 100 MG tablet Take 100 mg by mouth 2 (two) times daily.      meloxicam (MOBIC) 7.5 MG tablet Take 1 tablet (7.5 mg total) by mouth daily. 30 tablet 5    Multiple Vitamins-Minerals (CENTRUM ULTRA MENS PO) Take 1 tablet by mouth daily.      rosuvastatin (CRESTOR) 20 MG tablet Take 20 mg by mouth daily.      ibuprofen (ADVIL) 800 MG tablet Take 800 mg by mouth every 8 (eight) hours as needed.      potassium chloride SA (KLOR-CON M) 20 MEQ tablet Take 1 tablet (20 mEq total) by mouth 3 (three) times daily. 6 tablet 0    Allergies  Allergen Reactions   Ace Inhibitors Anaphylaxis    Social History   Tobacco Use   Smoking status: Never   Smokeless tobacco: Never  Substance Use Topics   Alcohol use: Yes    Family History  Problem Relation Age of Onset   Asthma Mother    Stroke Mother    COPD Father      Review of Systems The patient denies any chest pain shortness of breath numbness or tingling or significant back pain  Objective:  Physical Exam  Constitutional: General appearance development nutrition normal body habitus slim deformities none grooming excellent  Cardiovascular peripheral vascular system no swelling or varicose veins and  pulses and temperature normal with no edema or tenderness  Lymph nodes in the lower extremities normal  Musculoskeletal gait and station shows a varus knee no significant limp noticeable  No varus thrust  Left knee Musculoskeletal:     Comments: Examination of the left knee Skin is normal Joint effusion with tenderness medial joint line Range of motion he has a flexion contracture at less than 5 degrees his chronic his knee flexion is painful at terminal knee extension at 125 degrees Collateral and cruciate ligaments stable Muscle tone normal Provocative tests for meniscal pathology positive McMurray's     Examination right knee Skin is normal No effusion no tenderness medial  joint line or lateral joint line Range of motion 3-1 25 Collateral ligaments and cruciate ligaments are stable Muscle tone normal   The patient has normal lower extremity alternating movements heel-to-shin  Deep tendon reflexes intact negative straight leg raise is normal Babinski's  Sensation normal all 4 modalities  He is oriented to time person and place and his mood and affect are normal    Vital signs in last 24 hours:    Labs:   Estimated body mass index is 27.26 kg/m as calculated from the following:   Height as of 05/21/21: 5\' 10"  (1.778 m).   Weight as of 05/21/21: 86.2 kg.   Imaging Review Plain radiographs demonstrate mild degenerative joint disease of the left knee(s).  I reviewed the MRI scan paints a different picture   CARTILAGE   Patellofemoral: Partial-thickness cartilage loss with intermediate and high-grade chondral fissuring along the medial and lateral patellar facets and underlying marrow edema.   Medial: High-grade, near full-thickness cartilage loss along the weight-bearing surfaces.   Lateral: Shallow chondral fissuring along the lateral tibial plateau.   JOINT: Moderate-sized joint effusion.   POPLITEAL FOSSA: Small Baker's cyst.   EXTENSOR MECHANISM: Intact quadriceps tendon. Intact patellar tendon.   BONES: Tricompartment osteophyte formation. No acute fracture or dislocation. No aggressive osseous lesion.   Other: No additional findings.   IMPRESSION: 1. Complex degenerative tearing of the posterior horn and body of the medial meniscus with mild extrusion. 2. Tricompartment osteoarthritis, worst in the medial compartment, cartilaginous abnormalities as described above. 3. Moderate-sized joint effusion.  Small Baker's cyst.     Electronically Signed   By: Maurine Simmering M.D.   On: 04/06/2021 13:24   the overall alignment ismild varus. The bone quality appears to be good for age and reported activity  level.      Assessment/Plan:  End stage arthritis, left knee   The patient history, physical examination, clinical judgment of the provider and imaging studies are consistent with end stage degenerative joint disease of the left knee(s) and total knee arthroplasty is deemed medically necessary. The treatment options including medical management, injection therapy arthroscopy and arthroplasty were discussed at length. The risks and benefits of total knee arthroplasty were presented and reviewed. The risks due to aseptic loosening, infection, stiffness, patella tracking problems, thromboembolic complications and other imponderables were discussed. The patient acknowledged the explanation, agreed to proceed with the plan and consent was signed. Patient is being admitted for inpatient treatment for surgery, pain control, PT, OT, prophylactic antibiotics, VTE prophylaxis, progressive ambulation and ADL's and discharge planning. The patient is planning to be discharged  ED Monday with home health services

## 2021-05-27 NOTE — Progress Notes (Signed)
Meds ordered this encounter  Medications   potassium chloride SA (KLOR-CON M) 20 MEQ tablet    Sig: Take 1 tablet (20 mEq total) by mouth 3 (three) times daily.    Dispense:  6 tablet    Refill:  0

## 2021-05-28 ENCOUNTER — Encounter (HOSPITAL_COMMUNITY)
Admission: RE | Disposition: A | Discharge status: Home Health | Payer: Self-pay | Source: Ambulatory Visit | Attending: Orthopedic Surgery

## 2021-05-28 ENCOUNTER — Ambulatory Visit (HOSPITAL_COMMUNITY): Payer: 59

## 2021-05-28 ENCOUNTER — Encounter (HOSPITAL_COMMUNITY): Payer: Self-pay | Admitting: Orthopedic Surgery

## 2021-05-28 ENCOUNTER — Ambulatory Visit (HOSPITAL_COMMUNITY): Payer: 59 | Admitting: Anesthesiology

## 2021-05-28 ENCOUNTER — Ambulatory Visit (HOSPITAL_BASED_OUTPATIENT_CLINIC_OR_DEPARTMENT_OTHER): Payer: 59 | Admitting: Anesthesiology

## 2021-05-28 ENCOUNTER — Observation Stay (HOSPITAL_COMMUNITY)
Admission: RE | Admit: 2021-05-28 | Discharge: 2021-05-29 | Disposition: A | Discharge status: Home Health | Payer: 59 | Source: Ambulatory Visit | Attending: Orthopedic Surgery | Admitting: Orthopedic Surgery

## 2021-05-28 ENCOUNTER — Other Ambulatory Visit: Payer: Self-pay

## 2021-05-28 DIAGNOSIS — Z791 Long term (current) use of non-steroidal anti-inflammatories (NSAID): Secondary | ICD-10-CM | POA: Diagnosis not present

## 2021-05-28 DIAGNOSIS — Z79899 Other long term (current) drug therapy: Secondary | ICD-10-CM | POA: Diagnosis not present

## 2021-05-28 DIAGNOSIS — Z23 Encounter for immunization: Secondary | ICD-10-CM | POA: Diagnosis not present

## 2021-05-28 DIAGNOSIS — S83242A Other tear of medial meniscus, current injury, left knee, initial encounter: Secondary | ICD-10-CM | POA: Diagnosis not present

## 2021-05-28 DIAGNOSIS — Z888 Allergy status to other drugs, medicaments and biological substances status: Secondary | ICD-10-CM | POA: Diagnosis not present

## 2021-05-28 DIAGNOSIS — M1712 Unilateral primary osteoarthritis, left knee: Secondary | ICD-10-CM | POA: Diagnosis present

## 2021-05-28 DIAGNOSIS — M21162 Varus deformity, not elsewhere classified, left knee: Secondary | ICD-10-CM | POA: Diagnosis not present

## 2021-05-28 DIAGNOSIS — Z96652 Presence of left artificial knee joint: Secondary | ICD-10-CM

## 2021-05-28 DIAGNOSIS — I10 Essential (primary) hypertension: Secondary | ICD-10-CM | POA: Diagnosis not present

## 2021-05-28 HISTORY — PX: TOTAL KNEE ARTHROPLASTY: SHX125

## 2021-05-28 LAB — ABO/RH: ABO/RH(D): A POS

## 2021-05-28 SURGERY — ARTHROPLASTY, KNEE, TOTAL
Anesthesia: Spinal | Site: Knee | Laterality: Left

## 2021-05-28 MED ORDER — PROPOFOL 10 MG/ML IV BOLUS
INTRAVENOUS | Status: AC
  Filled 2021-05-28: qty 20

## 2021-05-28 MED ORDER — POTASSIUM CHLORIDE CRYS ER 20 MEQ PO TBCR
20.0000 meq | EXTENDED_RELEASE_TABLET | Freq: Three times a day (TID) | ORAL | Status: DC
  Administered 2021-05-28 – 2021-05-29 (×3): 20 meq via ORAL
  Filled 2021-05-28 (×3): qty 1

## 2021-05-28 MED ORDER — ONDANSETRON HCL 4 MG/2ML IJ SOLN
4.0000 mg | Freq: Once | INTRAMUSCULAR | Status: AC
  Administered 2021-05-28: 4 mg via INTRAVENOUS

## 2021-05-28 MED ORDER — PREGABALIN 50 MG PO CAPS
ORAL_CAPSULE | ORAL | Status: AC
  Filled 2021-05-28: qty 1

## 2021-05-28 MED ORDER — METHOCARBAMOL 1000 MG/10ML IJ SOLN
500.0000 mg | Freq: Once | INTRAVENOUS | Status: AC
  Administered 2021-05-28: 500 mg via INTRAVENOUS
  Filled 2021-05-28: qty 5

## 2021-05-28 MED ORDER — POLYETHYLENE GLYCOL 3350 17 G PO PACK: 17.0000 g | PACK | Freq: Every day | ORAL | Status: DC | PRN

## 2021-05-28 MED ORDER — MENTHOL 3 MG MT LOZG
1.0000 | LOZENGE | OROMUCOSAL | Status: DC | PRN
  Filled 2021-05-28: qty 9

## 2021-05-28 MED ORDER — DEXAMETHASONE SODIUM PHOSPHATE 4 MG/ML IJ SOLN
INTRAMUSCULAR | Status: AC
  Filled 2021-05-28: qty 2

## 2021-05-28 MED ORDER — ACETAMINOPHEN 325 MG PO TABS: 325.0000 mg | ORAL_TABLET | Freq: Four times a day (QID) | ORAL | Status: DC | PRN

## 2021-05-28 MED ORDER — ONDANSETRON HCL 4 MG PO TABS
4.0000 mg | ORAL_TABLET | Freq: Four times a day (QID) | ORAL | Status: DC | PRN
  Filled 2021-05-28: qty 1

## 2021-05-28 MED ORDER — CELECOXIB 400 MG PO CAPS
400.0000 mg | ORAL_CAPSULE | Freq: Once | ORAL | Status: AC
  Administered 2021-05-28: 400 mg via ORAL

## 2021-05-28 MED ORDER — 0.9 % SODIUM CHLORIDE (POUR BTL) OPTIME
TOPICAL | Status: DC | PRN
  Administered 2021-05-28: 1000 mL

## 2021-05-28 MED ORDER — ONDANSETRON HCL 4 MG/2ML IJ SOLN: 4.0000 mg | Freq: Once | INTRAMUSCULAR | Status: DC | PRN

## 2021-05-28 MED ORDER — DOCUSATE SODIUM 100 MG PO CAPS
100.0000 mg | ORAL_CAPSULE | Freq: Two times a day (BID) | ORAL | Status: DC
  Administered 2021-05-28 – 2021-05-29 (×2): 100 mg via ORAL
  Filled 2021-05-28 (×2): qty 1

## 2021-05-28 MED ORDER — HYDROCHLOROTHIAZIDE 12.5 MG PO TABS
12.5000 mg | ORAL_TABLET | Freq: Every day | ORAL | Status: DC
  Administered 2021-05-29: 12.5 mg via ORAL
  Filled 2021-05-28 (×2): qty 1

## 2021-05-28 MED ORDER — PANTOPRAZOLE SODIUM 40 MG PO TBEC
40.0000 mg | DELAYED_RELEASE_TABLET | Freq: Every day | ORAL | Status: DC
  Administered 2021-05-28 – 2021-05-29 (×2): 40 mg via ORAL
  Filled 2021-05-28 (×2): qty 1

## 2021-05-28 MED ORDER — ONDANSETRON HCL 4 MG/2ML IJ SOLN
INTRAMUSCULAR | Status: AC
  Filled 2021-05-28: qty 2

## 2021-05-28 MED ORDER — ALUM & MAG HYDROXIDE-SIMETH 200-200-20 MG/5ML PO SUSP: 30.0000 mL | ORAL | Status: DC | PRN

## 2021-05-28 MED ORDER — ORAL CARE MOUTH RINSE: 15.0000 mL | Freq: Once | OROMUCOSAL | Status: AC

## 2021-05-28 MED ORDER — INFLUENZA VAC SPLIT QUAD 0.5 ML IM SUSY
0.5000 mL | PREFILLED_SYRINGE | INTRAMUSCULAR | Status: AC
  Administered 2021-05-29: 0.5 mL via INTRAMUSCULAR
  Filled 2021-05-28: qty 0.5

## 2021-05-28 MED ORDER — AMLODIPINE BESYLATE 5 MG PO TABS
10.0000 mg | ORAL_TABLET | Freq: Every day | ORAL | Status: DC
  Administered 2021-05-29: 10 mg via ORAL
  Filled 2021-05-28 (×2): qty 1
  Filled 2021-05-28: qty 2
  Filled 2021-05-28: qty 1

## 2021-05-28 MED ORDER — BUPIVACAINE IN DEXTROSE 0.75-8.25 % IT SOLN
INTRATHECAL | Status: DC | PRN
Start: 2021-05-28 — End: 2021-05-28
  Administered 2021-05-28: 2 mL via INTRATHECAL

## 2021-05-28 MED ORDER — CELECOXIB 400 MG PO CAPS
ORAL_CAPSULE | ORAL | Status: AC
  Filled 2021-05-28: qty 1

## 2021-05-28 MED ORDER — HYDROMORPHONE HCL 1 MG/ML IJ SOLN: 0.5000 mg | INTRAMUSCULAR | Status: DC | PRN

## 2021-05-28 MED ORDER — METHOCARBAMOL 1000 MG/10ML IJ SOLN
500.0000 mg | Freq: Four times a day (QID) | INTRAVENOUS | Status: DC | PRN
  Filled 2021-05-28: qty 5

## 2021-05-28 MED ORDER — ALPRAZOLAM 0.5 MG PO TABS: 0.5000 mg | ORAL_TABLET | Freq: Two times a day (BID) | ORAL | Status: DC | PRN

## 2021-05-28 MED ORDER — LIDOCAINE HCL (PF) 1 % IJ SOLN
INTRAMUSCULAR | Status: AC
  Filled 2021-05-28: qty 30

## 2021-05-28 MED ORDER — BUPIVACAINE HCL (PF) 0.25 % IJ SOLN
INTRAMUSCULAR | Status: AC
  Filled 2021-05-28: qty 30

## 2021-05-28 MED ORDER — PREGABALIN 50 MG PO CAPS
50.0000 mg | ORAL_CAPSULE | Freq: Once | ORAL | Status: AC
  Administered 2021-05-28: 50 mg via ORAL

## 2021-05-28 MED ORDER — TRANEXAMIC ACID-NACL 1000-0.7 MG/100ML-% IV SOLN
1000.0000 mg | INTRAVENOUS | Status: AC
  Administered 2021-05-28: 1000 mg via INTRAVENOUS
  Filled 2021-05-28: qty 100

## 2021-05-28 MED ORDER — FENTANYL CITRATE (PF) 100 MCG/2ML IJ SOLN
INTRAMUSCULAR | Status: DC | PRN
  Administered 2021-05-28 (×2): 50 ug via INTRAVENOUS

## 2021-05-28 MED ORDER — MIDAZOLAM HCL 2 MG/2ML IJ SOLN
INTRAMUSCULAR | Status: AC
  Filled 2021-05-28: qty 2

## 2021-05-28 MED ORDER — CEFAZOLIN SODIUM-DEXTROSE 2-4 GM/100ML-% IV SOLN
2.0000 g | INTRAVENOUS | Status: AC
  Administered 2021-05-28: 2 g via INTRAVENOUS
  Filled 2021-05-28: qty 100

## 2021-05-28 MED ORDER — SODIUM CHLORIDE 0.9 % IR SOLN
Status: DC | PRN
  Administered 2021-05-28: 3000 mL

## 2021-05-28 MED ORDER — HYDROMORPHONE HCL 1 MG/ML IJ SOLN: 0.2500 mg | INTRAMUSCULAR | Status: DC | PRN

## 2021-05-28 MED ORDER — TRAMADOL HCL 50 MG PO TABS
50.0000 mg | ORAL_TABLET | Freq: Four times a day (QID) | ORAL | Status: DC
  Administered 2021-05-28 – 2021-05-29 (×5): 50 mg via ORAL
  Filled 2021-05-28 (×5): qty 1

## 2021-05-28 MED ORDER — METOCLOPRAMIDE HCL 5 MG/ML IJ SOLN: 5.0000 mg | Freq: Three times a day (TID) | INTRAMUSCULAR | Status: DC | PRN

## 2021-05-28 MED ORDER — OXYCODONE HCL 5 MG PO TABS
5.0000 mg | ORAL_TABLET | ORAL | Status: DC | PRN
  Administered 2021-05-28: 10 mg via ORAL
  Filled 2021-05-28: qty 2

## 2021-05-28 MED ORDER — DEXAMETHASONE SODIUM PHOSPHATE 4 MG/ML IJ SOLN
INTRAMUSCULAR | Status: DC | PRN
  Administered 2021-05-28: 8 mg via PERINEURAL

## 2021-05-28 MED ORDER — BUPIVACAINE HCL (PF) 0.25 % IJ SOLN
INTRAMUSCULAR | Status: DC | PRN
  Administered 2021-05-28: 30 mL via PERINEURAL

## 2021-05-28 MED ORDER — LABETALOL HCL 100 MG PO TABS
100.0000 mg | ORAL_TABLET | Freq: Two times a day (BID) | ORAL | Status: DC
  Administered 2021-05-28 – 2021-05-29 (×2): 100 mg via ORAL
  Filled 2021-05-28 (×6): qty 1

## 2021-05-28 MED ORDER — BUPIVACAINE-EPINEPHRINE (PF) 0.5% -1:200000 IJ SOLN
INTRAMUSCULAR | Status: AC
  Filled 2021-05-28: qty 30

## 2021-05-28 MED ORDER — METHOCARBAMOL 500 MG PO TABS
500.0000 mg | ORAL_TABLET | Freq: Four times a day (QID) | ORAL | Status: DC | PRN
  Filled 2021-05-28: qty 1

## 2021-05-28 MED ORDER — METOCLOPRAMIDE HCL 5 MG PO TABS
5.0000 mg | ORAL_TABLET | Freq: Three times a day (TID) | ORAL | Status: DC | PRN
  Filled 2021-05-28: qty 2

## 2021-05-28 MED ORDER — PROPOFOL 500 MG/50ML IV EMUL
INTRAVENOUS | Status: DC | PRN
  Administered 2021-05-28: 100 ug/kg/min via INTRAVENOUS

## 2021-05-28 MED ORDER — LACTATED RINGERS IV SOLN: INTRAVENOUS | Status: DC

## 2021-05-28 MED ORDER — CEFAZOLIN SODIUM-DEXTROSE 2-4 GM/100ML-% IV SOLN
2.0000 g | Freq: Four times a day (QID) | INTRAVENOUS | Status: AC
  Administered 2021-05-28 (×2): 2 g via INTRAVENOUS
  Filled 2021-05-28 (×2): qty 100

## 2021-05-28 MED ORDER — BUPIVACAINE-MELOXICAM ER 200-6 MG/7ML IJ SOLN
INTRAMUSCULAR | Status: DC | PRN
  Administered 2021-05-28: 400 mg

## 2021-05-28 MED ORDER — MEPERIDINE HCL 50 MG/ML IJ SOLN: 6.2500 mg | INTRAMUSCULAR | Status: DC | PRN

## 2021-05-28 MED ORDER — PHENOL 1.4 % MT LIQD
1.0000 | OROMUCOSAL | Status: DC | PRN
  Filled 2021-05-28: qty 177

## 2021-05-28 MED ORDER — TRANEXAMIC ACID-NACL 1000-0.7 MG/100ML-% IV SOLN
1000.0000 mg | Freq: Once | INTRAVENOUS | Status: AC
  Administered 2021-05-28: 1000 mg via INTRAVENOUS
  Filled 2021-05-28: qty 100

## 2021-05-28 MED ORDER — MIDAZOLAM HCL 5 MG/5ML IJ SOLN
INTRAMUSCULAR | Status: DC | PRN
Start: 2021-05-28 — End: 2021-05-28
  Administered 2021-05-28: 2 mg via INTRAVENOUS

## 2021-05-28 MED ORDER — DEXAMETHASONE SODIUM PHOSPHATE 10 MG/ML IJ SOLN
10.0000 mg | Freq: Once | INTRAMUSCULAR | Status: AC
  Administered 2021-05-29: 10 mg via INTRAVENOUS
  Filled 2021-05-28: qty 1

## 2021-05-28 MED ORDER — OXYCODONE HCL 5 MG PO TABS
ORAL_TABLET | ORAL | Status: AC
  Filled 2021-05-28: qty 1

## 2021-05-28 MED ORDER — BUPIVACAINE-MELOXICAM ER 200-6 MG/7ML IJ SOLN
INTRAMUSCULAR | Status: AC
  Filled 2021-05-28: qty 2

## 2021-05-28 MED ORDER — ASPIRIN EC 325 MG PO TBEC
325.0000 mg | DELAYED_RELEASE_TABLET | Freq: Every day | ORAL | Status: DC
  Administered 2021-05-29: 325 mg via ORAL
  Filled 2021-05-28 (×2): qty 1

## 2021-05-28 MED ORDER — DIPHENHYDRAMINE HCL 12.5 MG/5ML PO ELIX: 12.5000 mg | ORAL_SOLUTION | ORAL | Status: DC | PRN

## 2021-05-28 MED ORDER — ONDANSETRON HCL 4 MG/2ML IJ SOLN
INTRAMUSCULAR | Status: DC | PRN
  Administered 2021-05-28: 4 mg via INTRAVENOUS

## 2021-05-28 MED ORDER — FENTANYL CITRATE (PF) 100 MCG/2ML IJ SOLN
INTRAMUSCULAR | Status: AC
  Filled 2021-05-28: qty 2

## 2021-05-28 MED ORDER — POVIDONE-IODINE 10 % EX SWAB: 2.0000 "application " | Freq: Once | CUTANEOUS | Status: DC

## 2021-05-28 MED ORDER — ROSUVASTATIN CALCIUM 20 MG PO TABS
20.0000 mg | ORAL_TABLET | Freq: Every day | ORAL | Status: DC
  Administered 2021-05-28: 20 mg via ORAL
  Filled 2021-05-28: qty 1

## 2021-05-28 MED ORDER — CHLORHEXIDINE GLUCONATE 0.12 % MT SOLN
15.0000 mL | Freq: Once | OROMUCOSAL | Status: AC
  Administered 2021-05-28: 15 mL via OROMUCOSAL

## 2021-05-28 MED ORDER — SODIUM CHLORIDE 0.9 % IV SOLN: INTRAVENOUS | Status: DC

## 2021-05-28 MED ORDER — OXYCODONE HCL 5 MG PO TABS
5.0000 mg | ORAL_TABLET | Freq: Once | ORAL | Status: AC
  Administered 2021-05-28: 5 mg via ORAL

## 2021-05-28 MED ORDER — LIDOCAINE HCL (PF) 1 % IJ SOLN
INTRAMUSCULAR | Status: DC | PRN
  Administered 2021-05-28: 4 mL

## 2021-05-28 MED ORDER — ONDANSETRON HCL 4 MG/2ML IJ SOLN: 4.0000 mg | Freq: Four times a day (QID) | INTRAMUSCULAR | Status: DC | PRN

## 2021-05-28 SURGICAL SUPPLY — 55 items
ATTUNE MED DOME PAT 38 KNEE (Knees) ×1 IMPLANT
ATTUNE PS FEM LT SZ 7 CEM KNEE (Femur) ×1 IMPLANT
BANDAGE ESMARK 6X9 LF (GAUZE/BANDAGES/DRESSINGS) ×1 IMPLANT
BASE TIBIAL CEM ATTUNE SZ 7 (Knees) ×2 IMPLANT
BASEPLATE TIB CEM ATTUNE SZ7 (Knees) IMPLANT
BLADE SAGITTAL 25.0X1.27X90 (BLADE) ×2 IMPLANT
BLADE SAW SGTL 11.0X1.19X90.0M (BLADE) ×2 IMPLANT
BNDG ESMARK 6X9 LF (GAUZE/BANDAGES/DRESSINGS) ×2
CEMENT HV SMART SET (Cement) ×4 IMPLANT
CLOTH BEACON ORANGE TIMEOUT ST (SAFETY) ×2 IMPLANT
COOLER ICEMAN CLASSIC (MISCELLANEOUS) ×2 IMPLANT
COVER LIGHT HANDLE STERIS (MISCELLANEOUS) ×4 IMPLANT
CUFF TOURN SGL QUICK 34 (TOURNIQUET CUFF) ×2
CUFF TRNQT CYL 34X4.125X (TOURNIQUET CUFF) ×1 IMPLANT
DRAPE BACK TABLE (DRAPES) ×2 IMPLANT
DRAPE EXTREMITY T 121X128X90 (DISPOSABLE) ×2 IMPLANT
DRESSING AQUACEL AG ADV 3.5X12 (MISCELLANEOUS) ×1 IMPLANT
DRSG AQUACEL AG ADV 3.5X12 (MISCELLANEOUS) ×2
DURAPREP 26ML APPLICATOR (WOUND CARE) ×4 IMPLANT
ELECT REM PT RETURN 9FT ADLT (ELECTROSURGICAL) ×2
ELECTRODE REM PT RTRN 9FT ADLT (ELECTROSURGICAL) ×1 IMPLANT
GLOVE SS N UNI LF 8.5 STRL (GLOVE) ×2 IMPLANT
GLOVE SURG POLYISO LF SZ8 (GLOVE) ×4 IMPLANT
GLOVE SURG UNDER POLY LF SZ7 (GLOVE) ×4 IMPLANT
GOWN STRL REUS W/TWL LRG LVL3 (GOWN DISPOSABLE) ×6 IMPLANT
GOWN STRL REUS W/TWL XL LVL3 (GOWN DISPOSABLE) ×2 IMPLANT
HANDPIECE INTERPULSE COAX TIP (DISPOSABLE) ×2
HOOD W/PEELAWAY (MISCELLANEOUS) ×8 IMPLANT
INSERT TIB ATTUNE FB SZ7X8 (Insert) ×1 IMPLANT
INST SET MAJOR BONE (KITS) ×2 IMPLANT
IV NS IRRIG 3000ML ARTHROMATIC (IV SOLUTION) ×2 IMPLANT
KIT BLADEGUARD II DBL (SET/KITS/TRAYS/PACK) ×2 IMPLANT
KIT TURNOVER KIT A (KITS) ×2 IMPLANT
MANIFOLD NEPTUNE II (INSTRUMENTS) ×2 IMPLANT
MARKER SKIN DUAL TIP RULER LAB (MISCELLANEOUS) ×2 IMPLANT
NS IRRIG 1000ML POUR BTL (IV SOLUTION) ×2 IMPLANT
PACK TOTAL JOINT (CUSTOM PROCEDURE TRAY) ×2 IMPLANT
PAD ARMBOARD 7.5X6 YLW CONV (MISCELLANEOUS) ×2 IMPLANT
PAD COLD SHLDR WRAP-ON (PAD) ×2 IMPLANT
PILLOW KNEE EXTENSION 0 DEG (MISCELLANEOUS) ×2 IMPLANT
SAW OSC TIP CART 19.5X105X1.3 (SAW) ×2 IMPLANT
SET BASIN LINEN APH (SET/KITS/TRAYS/PACK) ×2 IMPLANT
SET HNDPC FAN SPRY TIP SCT (DISPOSABLE) ×1 IMPLANT
SPONGE T-LAP 18X18 ~~LOC~~+RFID (SPONGE) ×3 IMPLANT
STAPLER VISISTAT 35W (STAPLE) ×2 IMPLANT
SUT BRALON NAB BRD #1 30IN (SUTURE) ×3 IMPLANT
SUT MNCRL 0 VIOLET CTX 36 (SUTURE) ×1 IMPLANT
SUT MON AB 0 CT1 (SUTURE) ×2 IMPLANT
SUT MONOCRYL 0 CTX 36 (SUTURE) ×2
SYR BULB IRRIG 60ML STRL (SYRINGE) ×2 IMPLANT
TOWEL OR 17X26 4PK STRL BLUE (TOWEL DISPOSABLE) ×2 IMPLANT
TOWER CARTRIDGE SMART MIX (DISPOSABLE) ×2 IMPLANT
TRAY FOLEY MTR SLVR 16FR STAT (SET/KITS/TRAYS/PACK) ×2 IMPLANT
WATER STERILE IRR 1000ML POUR (IV SOLUTION) ×4 IMPLANT
YANKAUER SUCT 12FT TUBE ARGYLE (SUCTIONS) ×2 IMPLANT

## 2021-05-28 NOTE — Anesthesia Preprocedure Evaluation (Signed)
Anesthesia Evaluation  Patient identified by MRN, date of birth, ID band Patient awake    Reviewed: Allergy & Precautions, NPO status , Patient's Chart, lab work & pertinent test results  Airway Mallampati: III  TM Distance: >3 FB Neck ROM: Full    Dental  (+) Dental Advisory Given, Teeth Intact   Pulmonary neg pulmonary ROS,    Pulmonary exam normal breath sounds clear to auscultation       Cardiovascular Exercise Tolerance: Good hypertension, Pt. on medications Normal cardiovascular exam Rhythm:Regular Rate:Normal     Neuro/Psych PSYCHIATRIC DISORDERS Anxiety negative neurological ROS     GI/Hepatic negative GI ROS, Neg liver ROS,   Endo/Other  negative endocrine ROS  Renal/GU negative Renal ROS  negative genitourinary   Musculoskeletal negative musculoskeletal ROS (+)   Abdominal   Peds negative pediatric ROS (+)  Hematology negative hematology ROS (+)   Anesthesia Other Findings   Reproductive/Obstetrics negative OB ROS                            Anesthesia Physical Anesthesia Plan  ASA: 2  Anesthesia Plan: General/Spinal   Post-op Pain Management: Regional block* and Dilaudid IV   Induction:   PONV Risk Score and Plan: 3 and Ondansetron, Dexamethasone and Midazolam  Airway Management Planned: Nasal Cannula, Natural Airway and Simple Face Mask  Additional Equipment:   Intra-op Plan:   Post-operative Plan:   Informed Consent: I have reviewed the patients History and Physical, chart, labs and discussed the procedure including the risks, benefits and alternatives for the proposed anesthesia with the patient or authorized representative who has indicated his/her understanding and acceptance.     Dental advisory given  Plan Discussed with: CRNA and Surgeon  Anesthesia Plan Comments: (Possible GA with airway was discussed.)        Anesthesia Quick Evaluation

## 2021-05-28 NOTE — Progress Notes (Signed)
Pt arrived to room #321 via bed from PACU s/p Left knee arthroplasty. Pt A&O, c/o discomfort in left leg, mostly from having to keep leg straight in bone foam. Pt states understanding of importance to keep leg extended utilizing bone foam. Left leg with surgical dressing/elastic wrap intact, polar pack intact, Ted hose on. Foot warm to touch, toes cool, cap refill < 3 sec, good movement and sensation. IVF infusing without s/s infiltration. VSS. Wife at bedside. Both pt and wife oriented to room and safety procedures, plan of care and pain management plan, state understanding.

## 2021-05-28 NOTE — Transfer of Care (Signed)
Immediate Anesthesia Transfer of Care Note  Patient: Carl Erickson  Procedure(s) Performed: TOTAL KNEE ARTHROPLASTY (Left: Knee)  Patient Location: PACU  Anesthesia Type:Spinal and MAC combined with regional for post-op pain  Level of Consciousness: awake, alert , oriented and patient cooperative  Airway & Oxygen Therapy: Patient Spontanous Breathing  Post-op Assessment: Report given to RN and Post -op Vital signs reviewed and stable  Post vital signs: Reviewed and stable  Last Vitals:  Vitals Value Taken Time  BP 104/81 05/28/21 0942  Temp    Pulse 76 05/28/21 0943  Resp 16 05/28/21 0943  SpO2 95 % 05/28/21 0943  Vitals shown include unvalidated device data.  Last Pain:  Vitals:   05/28/21 0708  TempSrc: Oral  PainSc: 0-No pain      Patients Stated Pain Goal: 7 (62/69/[id number])  Complications: No notable events documented.

## 2021-05-28 NOTE — Anesthesia Postprocedure Evaluation (Signed)
Anesthesia Post Note  Patient: Carl Erickson  Procedure(s) Performed: TOTAL KNEE ARTHROPLASTY (Left: Knee)  Patient location during evaluation: PACU Anesthesia Type: Combined General/Spinal Level of consciousness: awake and alert and oriented Pain management: pain level controlled Vital Signs Assessment: post-procedure vital signs reviewed and stable Respiratory status: spontaneous breathing, nonlabored ventilation and respiratory function stable Cardiovascular status: blood pressure returned to baseline and stable Postop Assessment: no apparent nausea or vomiting and spinal receding Anesthetic complications: no   No notable events documented.   Last Vitals:  Vitals:   05/28/21 1100 05/28/21 1127  BP: 104/75 129/89  Pulse: 73 69  Resp: 19 16  Temp:    SpO2: 98% 98%    Last Pain:  Vitals:   05/28/21 1100  TempSrc:   PainSc: 2                  Kincaid Tiger C Renleigh Ouellet

## 2021-05-28 NOTE — Anesthesia Procedure Notes (Signed)
Anesthesia Regional Block: Adductor canal block   Pre-Anesthetic Checklist: , timeout performed,  Correct Patient, Correct Site, Correct Laterality,  Correct Procedure, Correct Position, site marked,  Risks and benefits discussed,  At surgeon's request and post-op pain management  Laterality: Left  Prep: Betadine, chloraprep       Needles:  Injection technique: Single-shot  Needle Type: Echogenic Stimulator Needle     Needle Length: 10cm  Needle Gauge: 20   Needle insertion depth: 6 cm   Additional Needles:   Procedures:, nerve stimulator,,, ultrasound used (permanent image in chart),,     Nerve Stimulator or Paresthesia:  Response: Twitch elicited, 0.6 mA, 6 cm  Additional Responses:   Narrative:  Start time: 05/28/2021 7:12 AM End time: 05/28/2021 7:20 AM Injection made incrementally with aspirations every 5 mL.  Performed by: Personally  Anesthesiologist: Denese Killings, MD  Additional Notes: BP cuff, EKG monitors applied. Sedation begun. After nerve location anesthetic injected incrementally, slowly , and after neg aspirations. Tolerated well.

## 2021-05-28 NOTE — Anesthesia Procedure Notes (Signed)
Spinal  Start time: 05/28/2021 7:41 AM End time: 05/28/2021 7:44 AM Reason for block: surgical anesthesia Staffing Performed: resident/CRNA  Resident/CRNA: Myna Bright, CRNA Preanesthetic Checklist Completed: patient identified, IV checked, site marked, risks and benefits discussed, surgical consent, monitors and equipment checked, pre-op evaluation and timeout performed Spinal Block Patient position: sitting Prep: DuraPrep Patient monitoring: heart rate, cardiac monitor, continuous pulse ox and blood pressure Approach: midline Location: L4-5 Injection technique: single-shot Needle Needle type: Pencan  Needle gauge: 24 G Needle length: 10 cm Needle insertion depth: 7 cm Assessment Sensory level: T10 Events: CSF return

## 2021-05-28 NOTE — Op Note (Addendum)
Dictation for total knee replacement  05/28/2021  9:45 AM  Preop diagnosis osteoarthritis LEFT  KNEE  Postop diagnosis osteoarthritis LEFT  KNEE  Procedure LEFT total knee arthroplasty  Implants   DePuy   Attune, fixed-bearing posterior stabilized total knee: Sizes: Femur   7   tibia  7   patella  38 X 10    POLYthylene 8  Bone cuts:  Distal femur  9  PROXIMAL TIBIA 9 FROM LATERAL TIBAI  PATELLA 23 TO 12        Findings: medial compartment OA, posterior horn medial meniscus tear  Patellofemoral central ridge defect, mild varus   ASSISTANTS: CYNTHIA WRENN   ANESTHESIA:   spinal with preop nerve block   BLOOD ADMINISTERED:none  DRAINS: none   LOCAL MEDICATIONS USED:  EXPAREL  SPECIMEN:  No Specimen  DISPOSITION OF SPECIMEN:  N/A  COUNTS:  YES  TOURNIQUET:  280 MM 77 MIN   DICTATION: .Dragon Dictation   The patient was taken to the recovery room in stable condition  PLAN OF CARE: ROUTINE  PATIENT DISPOSITION:  PACU - hemodynamically stable.   Delay start of Pharmacological VTE agent (>24hrs) due to surgical blood loss or risk of bleeding: not applicable  Details of surgery: The patient was identified by 2 approved identification mechanisms. The operative extremity was evaluated and found to be acceptable for surgical treatment today. The chart was reviewed. The surgical site was confirmed and marked. The patient had a preop saphenous nerve block YES  The patient was taken to the operating room and given appropriate antibiotic ANCEF 2GM . This is consistent with the SCIP protocol.  The patient was given the following anesthetic: spinal with proeop nerve block   The patient was then placed supine on the operating table. A Foley catheter was inserted. The operative extremity was prepped and draped sterilely from the toes to the groin.  Timeout was executed confirming the patient's name, surgical site, antibiotic administration, x-rays available, and implants  available.  The operative limb,  was exsanguinated with a six-inch Esmarch and the tourniquet was inflated to 280 mmHg.  A straight midline incision was made over the LEFT KNEE and taken down to the extensor mechanism. A medial arthrotomy was performed. The patella was everted and the patellofemoral soft tissue was released, along with the patellar fat pad.  The anterior cruciate ligament and PCL were resected.  The anterior horns of the lateral and medial meniscus were resected. The medial soft tissue sleeve was elevated to the mid coronal plane.  A three-eighths inch drill bit was used to enter the femoral canal which was decompressed with suction and irrigation until clear.   The distal femoral cutting guide was set for 9 mm distal resection,  5valgus alignment, for a LEFT knee. The distal femur was resected and checked for flatness.  The Depuy Sigma sizing femoral guide was placed and the femur was sized to a size 7 .   The external alignment guide for the tibial resection was then applied to the distal and proximal tibia and set for anatomic slope along with 9 MM resection  from the  LATERAL .   Rotational alignment was set using the malleolus, the tibial tubercle and the tibial spines.  The proximal tibia was resected along with  residual menisci. The tibia was sized using a base plate to a size  7 .   The extension gap was checked.  8 FIT WELL     A 4-in-1 cutting block  was placed along with collateral ligament retractors and the distal femoral cuts were completed.  Spacer blocks were used to confirm equal flexion extension gaps with releases done as needed. A size 8 MM spacer block gave equal stability and flexion extension.  The correct sized notch cutting guide for the femur was then applied and the notch cut was made.  Trial reduction was completed using size7F AND 7 TIB W 8 MM POLY  trial implants. Patella tracking was normal  We then skeletonized the patella. It measured  2 in thickness and the patellar resection was set for 22/25 millimeters. the patellar resection was completed. The patella diameter measured 38. We then drilled the peg holes for the patella.  Completed patellar thickness was 22  The proximal tibia was prepared using the size 7 base plate.  Thorough irrigation was performed and the bone was dried and prepared for cement. The cement was mixed on the back table using third generation preparation techniques  The implants were then cemented in place and excess cement was removed. The cement was allowed to cure. Irrigation was repeated and excess and residual bone fragments and cement were removed.  ZYNRELEF WAS INJECTED INTO THE GUTTERS AND AROUNG THE BONE/IMPLANT INTERAFCE   The extensor mechanism was closed with #1 Bralon suture followed by subcutaneous tissue closure using 0 Monocryl suture   Skin approximation was performed using staples  A sterile dressing AQUACEL was applied, followed by a TED HOSE  foot to thigh and then a Cryo/Cuff which was activated   The patient was taken recovery room in stable condition

## 2021-05-28 NOTE — Evaluation (Signed)
Physical Therapy Evaluation Patient Details Name: Carl Erickson MRN: [id number] DOB: June 15, 1960 Today's Date: 05/28/2021   RIGHT/LEFT KNEE ROM:  0 - 82 degrees AMBULATION DISTANCE: 120 feet using RW with Supervision   History of Present Illness  LENVILLE HIBBERD a 61 y/o male s/p Left TKA on 05/28/21, with the diagnosis of osteoarthritis left knee.  Clinical Impression  Patient instructed in/issued HEP for left knee with good return demonstrated and understanding acknowledge, achieved up to 82 degrees left knee flexion self flexing while seated at bedside, fair/good return for left heel to toe stepping after verbal cues during gait training, no loss of balance and limited mostly due to c/o fatigue and mild increase in left knee pain.  Patient tolerated sitting up in chair after therapy with his spouse present in room.  Patient will benefit from continued skilled physical therapy in hospital and recommended venue below to increase strength, balance, endurance for safe ADLs and gait.         Recommendations for follow up therapy are one component of a multi-disciplinary discharge planning process, led by the attending physician.  Recommendations may be updated based on patient status, additional functional criteria and insurance authorization.  Follow Up Recommendations Home health PT    Assistance Recommended at Discharge Set up Supervision/Assistance  Patient can return home with the following  A little help with walking and/or transfers;A little help with bathing/dressing/bathroom;Help with stairs or ramp for entrance;Assistance with cooking/housework    Equipment Recommendations BSC/3in1  Recommendations for Other Services       Functional Status Assessment Patient has had a recent decline in their functional status and demonstrates the ability to make significant improvements in function in a reasonable and predictable amount of time.     Precautions / Restrictions  Precautions Precautions: Fall Restrictions Weight Bearing Restrictions: Yes LLE Weight Bearing: Weight bearing as tolerated      Mobility  Bed Mobility Overal bed mobility: Needs Assistance Bed Mobility: Supine to Sit     Supine to sit: Modified independent (Device/Increase time), Supervision     General bed mobility comments: patient had to use BUE to hold LLE during supine to sitting    Transfers Overall transfer level: Needs assistance Equipment used: Rolling walker (2 wheels) Transfers: Sit to/from Stand, Bed to chair/wheelchair/BSC Sit to Stand: Supervision   Step pivot transfers: Supervision       General transfer comment: required verbal cues for proper hand placement during sit to stands, stand to sitting with fair/good carryover    Ambulation/Gait Ambulation/Gait assistance: Supervision Gait Distance (Feet): 120 Feet Assistive device: Rolling walker (2 wheels) Gait Pattern/deviations: Decreased step length - left, Decreased stance time - left, Decreased stride length Gait velocity: decreased     General Gait Details: slightly labored cadence without loss of balance with fair/good return for left heel to toe stepping, limited mostly due to fatigue and left knee pain  Stairs            Wheelchair Mobility    Modified Rankin (Stroke Patients Only)       Balance Overall balance assessment: Needs assistance Sitting-balance support: Feet supported, No upper extremity supported Sitting balance-Leahy Scale: Good Sitting balance - Comments: seated at EOB   Standing balance support: During functional activity, Bilateral upper extremity supported Standing balance-Leahy Scale: Fair Standing balance comment: fair/good using RW  Pertinent Vitals/Pain Pain Assessment Pain Assessment: 0-10 Pain Score: 8  Pain Location: Left knee Pain Descriptors / Indicators: Sore Pain Intervention(s): Limited activity within  patient's tolerance, Monitored during session, Premedicated before session, Repositioned    Home Living Family/patient expects to be discharged to:: Private residence Living Arrangements: Spouse/significant other Available Help at Discharge: Family;Available 24 hours/day Type of Home: House Home Access: Stairs to enter Entrance Stairs-Rails: Right Entrance Stairs-Number of Steps: 2   Home Layout: One level Home Equipment: Conservation officer, nature (2 wheels);Cane - single point      Prior Function Prior Level of Function : Independent/Modified Independent             Mobility Comments: Hydrographic surveyor, drives ADLs Comments: Independent     Hand Dominance   Dominant Hand: Right    Extremity/Trunk Assessment   Upper Extremity Assessment Upper Extremity Assessment: Overall WFL for tasks assessed    Lower Extremity Assessment Lower Extremity Assessment: Overall WFL for tasks assessed;LLE deficits/detail LLE Deficits / Details: grossly -4/5 LLE: Unable to fully assess due to pain LLE Sensation: WNL LLE Coordination: WNL    Cervical / Trunk Assessment Cervical / Trunk Assessment: Normal  Communication   Communication: No difficulties  Cognition Arousal/Alertness: Awake/alert Behavior During Therapy: WFL for tasks assessed/performed Overall Cognitive Status: Within Functional Limits for tasks assessed                                          General Comments      Exercises Total Joint Exercises Ankle Circles/Pumps: Supine, 10 reps, Left, AROM, Strengthening Quad Sets: Supine, 10 reps, Left, Strengthening, AROM Short Arc Quad: AROM, Strengthening, Left, 10 reps, Supine Heel Slides: AROM, Strengthening, Left, 10 reps, Supine Goniometric ROM: left knee: 0 - 82 degrees   Assessment/Plan    PT Assessment Patient needs continued PT services  PT Problem List Decreased strength;Decreased range of motion;Decreased activity tolerance;Decreased  balance;Decreased mobility       PT Treatment Interventions DME instruction;Gait training;Stair training;Functional mobility training;Therapeutic activities;Therapeutic exercise;Patient/family education;Balance training    PT Goals (Current goals can be found in the Care Plan section)  Acute Rehab PT Goals Patient Stated Goal: return home with family to assist PT Goal Formulation: With patient/family Time For Goal Achievement: 05/30/21 Potential to Achieve Goals: Good    Frequency BID     Co-evaluation               AM-PAC PT "6 Clicks" Mobility  Outcome Measure Help needed turning from your back to your side while in a flat bed without using bedrails?: None Help needed moving from lying on your back to sitting on the side of a flat bed without using bedrails?: None Help needed moving to and from a bed to a chair (including a wheelchair)?: A Little Help needed standing up from a chair using your arms (e.g., wheelchair or bedside chair)?: A Little Help needed to walk in hospital room?: A Little Help needed climbing 3-5 steps with a railing? : A Little 6 Click Score: 20    End of Session   Activity Tolerance: Patient tolerated treatment well;Patient limited by fatigue;Patient limited by pain Patient left: in chair;with call bell/phone within reach;with family/visitor present Nurse Communication: Mobility status PT Visit Diagnosis: Unsteadiness on feet (R26.81);Other abnormalities of gait and mobility (R26.89);Muscle weakness (generalized) (M62.81)    Time: 7253-6644 PT Time Calculation (min) (ACUTE ONLY):  30 min   Charges:   PT Evaluation $PT Eval Moderate Complexity: 1 Mod PT Treatments $Therapeutic Activity: 23-37 mins        2:02 PM, 05/28/21 Lonell Grandchild, MPT Physical Therapist with [facility] [phone] office [phone] mobile phone

## 2021-05-28 NOTE — Interval H&P Note (Signed)
History and Physical Interval Note:  05/28/2021 7:26 AM  Carl Erickson  has presented today for surgery, with the diagnosis of osteoarthritis left knee.  The various methods of treatment have been discussed with the patient and family. After consideration of risks, benefits and other options for treatment, the patient has consented to  Procedure(s): TOTAL KNEE ARTHROPLASTY (Left) as a surgical intervention.  The patient's history has been reviewed, patient examined, no change in status, stable for surgery.  I have reviewed the patient's chart and labs.  Questions were answered to the patient's satisfaction.     Arther Abbott

## 2021-05-28 NOTE — Brief Op Note (Signed)
05/28/2021  9:43 AM  PATIENT:  Carl Erickson  61 y.o. male  PRE-OPERATIVE DIAGNOSIS:  osteoarthritis left knee  POST-OPERATIVE DIAGNOSIS:  osteoarthritis left knee  PROCEDURE:  Procedure(s): TOTAL KNEE ARTHROPLASTY (Left)  SURGEON:  Surgeon(s) and Role:    Carole Civil, MD - Primary  PHYSICIAN ASSISTANT:   ASSISTANTS: cynthia wrenn   ANESTHESIA:   none  EBL:  50 mL   BLOOD ADMINISTERED:none  DRAINS: none   LOCAL MEDICATIONS USED:  OTHER zynrelef  SPECIMEN:  No Specimen  DISPOSITION OF SPECIMEN:  N/A  COUNTS:  YES  TOURNIQUET:   Total Tourniquet Time Documented: Thigh (Left) - 77 minutes Total: Thigh (Left) - 77 minutes   DICTATION: .Viviann Spare Dictation  PLAN OF CARE: Admit for overnight observation  PATIENT DISPOSITION:  PACU - hemodynamically stable.   Delay start of Pharmacological VTE agent (>24hrs) due to surgical blood loss or risk of bleeding: yes

## 2021-05-28 NOTE — Plan of Care (Signed)
°  Problem: Acute Rehab PT Goals(only PT should resolve) Goal: Pt Will Go Supine/Side To Sit Outcome: Progressing Flowsheets (Taken 05/28/2021 1403) Pt will go Supine/Side to Sit:  with modified independence  Independently Goal: Patient Will Transfer Sit To/From Stand Outcome: Progressing Flowsheets (Taken 05/28/2021 1403) Patient will transfer sit to/from stand: with modified independence Goal: Pt Will Transfer Bed To Chair/Chair To Bed Outcome: Progressing Flowsheets (Taken 05/28/2021 1403) Pt will Transfer Bed to Chair/Chair to Bed: with modified independence Goal: Pt Will Ambulate Outcome: Progressing Flowsheets (Taken 05/28/2021 1403) Pt will Ambulate:  > 125 feet  with modified independence  with rolling walker   2:03 PM, 05/28/21 Lonell Grandchild, MPT Physical Therapist with [facility] [phone] office [phone] mobile phone

## 2021-05-29 DIAGNOSIS — M1712 Unilateral primary osteoarthritis, left knee: Secondary | ICD-10-CM | POA: Diagnosis not present

## 2021-05-29 LAB — BASIC METABOLIC PANEL
Anion gap: 8 (ref 5–15)
BUN: 10 mg/dL (ref 6–20)
CO2: 24 mmol/L (ref 22–32)
Calcium: 8.7 mg/dL — ABNORMAL LOW (ref 8.9–10.3)
Chloride: 107 mmol/L (ref 98–111)
Creatinine, Ser: 0.83 mg/dL (ref 0.61–1.24)
GFR, Estimated: >60 mL/min (ref >60)
Glucose, Bld: 126 mg/dL — ABNORMAL HIGH (ref 70–99)
Potassium: 4 mmol/L (ref 3.5–5.1)
Sodium: 139 mmol/L (ref 135–145)

## 2021-05-29 LAB — CBC
HCT: 35.6 % — ABNORMAL LOW (ref 39.0–52.0)
Hemoglobin: 12 g/dL — ABNORMAL LOW (ref 13.0–17.0)
MCH: 31.6 pg (ref 26.0–34.0)
MCHC: 33.7 g/dL (ref 30.0–36.0)
MCV: 93.7 fL (ref 80.0–100.0)
Platelets: 277 10*3/uL (ref 150–400)
RBC: 3.8 MIL/uL — ABNORMAL LOW (ref 4.22–5.81)
RDW: 12.4 % (ref 11.5–15.5)
WBC: 14.7 10*3/uL — ABNORMAL HIGH (ref 4.0–10.5)
nRBC: 0 % (ref 0.0–0.2)

## 2021-05-29 MED ORDER — METHOCARBAMOL 500 MG PO TABS: 500.0000 mg | ORAL_TABLET | Freq: Four times a day (QID) | ORAL | 0 refills | Status: DC | PRN

## 2021-05-29 MED ORDER — POLYETHYLENE GLYCOL 3350 17 G PO PACK: 17.0000 g | PACK | Freq: Every day | ORAL | 0 refills | Status: DC | PRN

## 2021-05-29 MED ORDER — TRAMADOL HCL 50 MG PO TABS: 50.0000 mg | ORAL_TABLET | Freq: Four times a day (QID) | ORAL | 0 refills | Status: AC

## 2021-05-29 MED ORDER — ASPIRIN 325 MG PO TBEC: 325.0000 mg | DELAYED_RELEASE_TABLET | Freq: Every day | ORAL | 0 refills | Status: DC

## 2021-05-29 MED ORDER — DOCUSATE SODIUM 100 MG PO CAPS: 100.0000 mg | ORAL_CAPSULE | Freq: Two times a day (BID) | ORAL | 0 refills | Status: DC

## 2021-05-29 MED ORDER — OXYCODONE HCL 5 MG PO TABS: 5.0000 mg | ORAL_TABLET | Freq: Four times a day (QID) | ORAL | 0 refills | Status: AC | PRN

## 2021-05-29 NOTE — Discharge Summary (Signed)
Physician Discharge Summary  Patient ID: Carl Erickson MRN: [id number] DOB/AGE: Jul 12, 1960 61 y.o.  Admit date: 05/28/2021 Discharge date: 05/29/2021  Admission Diagnoses: left knee oa   Discharge Diagnoses: left knee oa   Discharged Condition: good  Procedure: left tka, Attune FB PS 50F 7T 8 poly 38 patella  Hospital Course: unremarkable   Advanced well 0 - 82 degrees AMBULATION DISTANCE: 120 feet using RW with Supervision  Discharge Exam: BP 130/89    Pulse 82    Temp 97.8 F (36.6 C) (Oral)    Resp 16    Ht 5\' 10"  (1.778 m)    Wt 87.8 kg    SpO2 100%    BMI 27.77 kg/m  Physical Exam  Alert oriented  Neurovascular intact   No swelling   Disposition: Discharge disposition: 01-Home or Self Care       Discharge Instructions     Call MD / Call 911   Complete by: As directed    If you experience chest pain or shortness of breath, CALL 911 and be transported to the hospital emergency room.  If you develope a fever above 101 F, pus (white drainage) or increased drainage or redness at the wound, or calf pain, call your surgeon's office.   Constipation Prevention   Complete by: As directed    Drink plenty of fluids.  Prune juice may be helpful.  You may use a stool softener, such as Colace (over the counter) 100 mg twice a day.  Use MiraLax (over the counter) for constipation as needed.   Diet - low sodium heart healthy   Complete by: As directed    Discharge instructions   Complete by: As directed    CPM 6 hrs per day   Ice as much as possible   Exercises a s taught by PT   No shower for 2 weeks   Driving restrictions   Complete by: As directed    No driving for 2 weeks   Increase activity slowly as tolerated   Complete by: As directed    Post-operative opioid taper instructions:   Complete by: As directed    POST-OPERATIVE OPIOID TAPER INSTRUCTIONS: It is important to wean off of your opioid medication as soon as possible. If you do not need pain  medication after your surgery it is ok to stop day one. Opioids include: Codeine, Hydrocodone(Norco, Vicodin), Oxycodone(Percocet, oxycontin) and hydromorphone amongst others.  Long term and even short term use of opiods can cause: Increased pain response Dependence Constipation Depression Respiratory depression And more.  Withdrawal symptoms can include Flu like symptoms Nausea, vomiting And more Techniques to manage these symptoms Hydrate well Eat regular healthy meals Stay active Use relaxation techniques(deep breathing, meditating, yoga) Do Not substitute Alcohol to help with tapering If you have been on opioids for less than two weeks and do not have pain than it is ok to stop all together.  Plan to wean off of opioids This plan should start within one week post op of your joint replacement. Maintain the same interval or time between taking each dose and first decrease the dose.  Cut the total daily intake of opioids by one tablet each day Next start to increase the time between doses. The last dose that should be eliminated is the evening dose.         Allergies as of 05/29/2021       Reactions   Ace Inhibitors Anaphylaxis        Medication  List     STOP taking these medications    ibuprofen 800 MG tablet Commonly known as: ADVIL   meloxicam 7.5 MG tablet Commonly known as: Mobic       TAKE these medications    ALPRAZolam 1 MG tablet Commonly known as: XANAX Take 0.5-1 mg by mouth 2 (two) times daily as needed for anxiety.   amLODipine 10 MG tablet Commonly known as: NORVASC Take 10 mg by mouth daily.   aspirin 325 MG EC tablet Take 1 tablet (325 mg total) by mouth daily with breakfast. Start taking on: May 30, 2021   CENTRUM ULTRA MENS PO Take 1 tablet by mouth daily.   docusate sodium 100 MG capsule Commonly known as: COLACE Take 1 capsule (100 mg total) by mouth 2 (two) times daily.   hydrochlorothiazide 12.5 MG tablet Commonly  known as: HYDRODIURIL Take 12.5 mg by mouth daily.   labetalol 100 MG tablet Commonly known as: NORMODYNE Take 100 mg by mouth 2 (two) times daily.   methocarbamol 500 MG tablet Commonly known as: ROBAXIN Take 1 tablet (500 mg total) by mouth every 6 (six) hours as needed for muscle spasms.   oxyCODONE 5 MG immediate release tablet Commonly known as: Oxy IR/ROXICODONE Take 1 tablet (5 mg total) by mouth every 6 (six) hours as needed for up to 7 days for severe pain.   polyethylene glycol 17 g packet Commonly known as: MIRALAX / GLYCOLAX Take 17 g by mouth daily as needed for mild constipation.   potassium chloride SA 20 MEQ tablet Commonly known as: KLOR-CON M Take 1 tablet (20 mEq total) by mouth 3 (three) times daily.   rosuvastatin 20 MG tablet Commonly known as: CRESTOR Take 20 mg by mouth daily.   traMADol 50 MG tablet Commonly known as: ULTRAM Take 1 tablet (50 mg total) by mouth 4 (four) times daily for 7 days.        Follow-up Information     Carole Civil, MD Follow up.   Specialties: Orthopedic Surgery, Radiology Contact information: [address] [place] [address] [phone]                 Signed: Arther Abbott 05/29/2021, 11:37 AM

## 2021-05-29 NOTE — Progress Notes (Signed)
CSW spoke with Marjory Lies at [place] who states the patient will receive home health services at discharge.  Madilyn Fireman, MSW, LCSW Transitions of Care   Clinical Social Worker II [phone]

## 2021-05-29 NOTE — Progress Notes (Signed)
Physical Therapy Treatment Patient Details Name: Carl Erickson MRN: [id number] DOB: 23-Sep-1960 Today's Date: 05/29/2021  LT knee AROM: -2 to 41 degrees Distance ambulated: 41 feet with RW   History of Present Illness Carl Erickson a 61 y/o male s/p Left TKA on 05/28/21, with the diagnosis of osteoarthritis left knee.    PT Comments    Patient sitting upright in bed upon arrival with wife present in room. Patient is pleasant and cooperative. Patient states he is preparing for DC at present. Reviewed therapy plan with patient and wife. Reviewed and performed seated LE strengthening at EOB. Encouraged tolerated knee AAROM flexion with 2-80 degrees AROM measured following. Reviewed CPM and vasopnuematic compression use protocols. Patient verbalized good return. Patient states he has been able to walk short distance to bathroom without AD. Encouraged patient to utilize AD until pain is minimized to reduce risk for injury or falls. Practiced gait using RW to hallway, patient showing good return and good application of WBAT through LLE. Patient returned to room, seated in chair. Nursing present with DC orders. Patient will be DC from PT caseload as he is scheduled to leaving this afternoon, where he is scheduled to begin HHPT. Acute PT signing off.     Recommendations for follow up therapy are one component of a multi-disciplinary discharge planning process, led by the attending physician.  Recommendations may be updated based on patient status, additional functional criteria and insurance authorization.  Follow Up Recommendations  Home health PT     Assistance Recommended at Discharge Set up Supervision/Assistance  Patient can return home with the following A little help with walking and/or transfers;A little help with bathing/dressing/bathroom;Help with stairs or ramp for entrance;Assistance with cooking/housework   Equipment Recommendations  BSC/3in1    Recommendations for Other Services        Precautions / Restrictions Precautions Precautions: None Restrictions Weight Bearing Restrictions: Yes LLE Weight Bearing: Weight bearing as tolerated     Mobility  Bed Mobility Overal bed mobility: Modified Independent Bed Mobility: Supine to Sit     Supine to sit: Modified independent (Device/Increase time), Supervision     General bed mobility comments: patient had to use BUE to hold LLE during supine to sitting    Transfers Overall transfer level: Modified independent Equipment used: Rolling walker (2 wheels)   Sit to Stand: Modified independent (Device/Increase time), Supervision                Ambulation/Gait Ambulation/Gait assistance: Supervision Gait Distance (Feet): 60 Feet Assistive device: Rolling walker (2 wheels) Gait Pattern/deviations: Decreased step length - left, Decreased stance time - left, Decreased stride length Gait velocity: decreased     General Gait Details: slightly labored cadence without loss of balance, decreased WB through LLE   Stairs             Wheelchair Mobility    Modified Rankin (Stroke Patients Only)       Balance Overall balance assessment: Modified Independent Sitting-balance support: Feet supported, No upper extremity supported Sitting balance-Leahy Scale: Good Sitting balance - Comments: seated at EOB   Standing balance support: During functional activity, Bilateral upper extremity supported Standing balance-Leahy Scale: Fair Standing balance comment: fair/good using RW                            Cognition Arousal/Alertness: Awake/alert Behavior During Therapy: WFL for tasks assessed/performed Overall Cognitive Status: Within Functional Limits for tasks assessed  Exercises Total Joint Exercises Ankle Circles/Pumps: 10 reps, Left, AROM, Seated Heel Slides: AROM, Strengthening, Left, 10 reps, Supine Long Arc Quad:  Strengthening, Left, 10 reps, Seated Knee Flexion: 5 reps, Left, AAROM, Seated Goniometric ROM: 2-80 degrees LT knee  AROM    General Comments        Pertinent Vitals/Pain Pain Assessment Pain Assessment: 0-10 Pain Score: 7  Pain Location: Left knee Pain Descriptors / Indicators: Sore Pain Intervention(s): Limited activity within patient's tolerance, Monitored during session    Home Living                          Prior Function            PT Goals (current goals can now be found in the care plan section) Acute Rehab PT Goals Patient Stated Goal: return home with family to assist PT Goal Formulation: With patient/family Time For Goal Achievement: 05/30/21 Potential to Achieve Goals: Good Progress towards PT goals: Progressing toward goals    Frequency    BID      PT Plan Current plan remains appropriate    Co-evaluation              AM-PAC PT "6 Clicks" Mobility   Outcome Measure  Help needed turning from your back to your side while in a flat bed without using bedrails?: None Help needed moving from lying on your back to sitting on the side of a flat bed without using bedrails?: None Help needed moving to and from a bed to a chair (including a wheelchair)?: A Little Help needed standing up from a chair using your arms (e.g., wheelchair or bedside chair)?: A Little Help needed to walk in hospital room?: A Little Help needed climbing 3-5 steps with a railing? : A Little 6 Click Score: 20    End of Session   Activity Tolerance: Patient tolerated treatment well;Patient limited by fatigue Patient left: in chair;with call bell/phone within reach;with family/visitor present Nurse Communication: Mobility status PT Visit Diagnosis: Unsteadiness on feet (R26.81);Other abnormalities of gait and mobility (R26.89);Muscle weakness (generalized) (M62.81)     Time: 8828-0034 PT Time Calculation (min) (ACUTE ONLY): 16 min  Charges:  $Therapeutic  Exercise: 8-22 mins                    12:18 PM, 05/29/21 Josue Hector PT DPT  Physical Therapist with [facility]  [phone]

## 2021-05-29 NOTE — Progress Notes (Signed)
Discharge instructions given pt and wife verbalized understanding. Discharged via wheelchair to private vehicle.

## 2021-06-02 ENCOUNTER — Encounter (HOSPITAL_COMMUNITY): Payer: Self-pay | Admitting: Orthopedic Surgery

## 2021-06-08 ENCOUNTER — Ambulatory Visit (INDEPENDENT_AMBULATORY_CARE_PROVIDER_SITE_OTHER): Payer: 59 | Admitting: Orthopedic Surgery

## 2021-06-08 ENCOUNTER — Other Ambulatory Visit: Payer: Self-pay

## 2021-06-08 DIAGNOSIS — M1712 Unilateral primary osteoarthritis, left knee: Secondary | ICD-10-CM

## 2021-06-08 DIAGNOSIS — Z96652 Presence of left artificial knee joint: Secondary | ICD-10-CM

## 2021-06-08 MED ORDER — TRAMADOL HCL 50 MG PO TABS
50.0000 mg | ORAL_TABLET | Freq: Four times a day (QID) | ORAL | 0 refills | Status: DC | PRN
Start: 2021-06-08 — End: 2021-06-30

## 2021-06-08 NOTE — Addendum Note (Signed)
Addended by: Obie Dredge A on: 06/08/2021 10:42 AM ? ? Modules accepted: Orders ? ?

## 2021-06-08 NOTE — Progress Notes (Signed)
Chief Complaint  ?Patient presents with  ? Post-op Follow-up  ?  Left tka 05/29/21  ? ?Encounter Diagnoses  ?Name Primary?  ? Primary osteoarthritis of left knee Yes  ? Status post total left knee replacement   ? ? ?Carl Erickson is doing well with his knee replacement.  He walked down the hall with a walker with a slight flexed gait at the knee joint ? ?He is still struggling with the bone foam a little bit but uses it is much as he can his knee flexion by therapy measurement is 92 degrees ? ?His wound looks good he has minimal swelling in the ankle area ? ?He does not want any more oxycodone ? ?He can start outpatient therapy in [place] in a week we refilled his tramadol ? ?Continue aspirin and TED hose for the balance of 4 weeks from surgery ? ?Meds ordered this encounter  ?Medications  ? traMADol (ULTRAM) 50 MG tablet  ?  Sig: Take 1 tablet (50 mg total) by mouth every 6 (six) hours as needed.  ?  Dispense:  28 tablet  ?  Refill:  0  ? ? ?

## 2021-06-14 ENCOUNTER — Ambulatory Visit (HOSPITAL_COMMUNITY): Payer: 59 | Admitting: Physical Therapy

## 2021-06-22 ENCOUNTER — Telehealth: Payer: Self-pay | Admitting: Orthopedic Surgery

## 2021-06-22 NOTE — Telephone Encounter (Signed)
Patient called and said that Dr. Aline Brochure said to take every over staple out on his last visit and he came in and looked at it and said everything is looking good to go ahead and take them all out.  He states the assistant said she might have left one or two still there.  ? ?He has been going to PT and there are still 8 staples left, and his PT person said he needs to get the rest out, it is hindering him from doing his PT.   ? ?He wants to come in and be seen to get the rest of the staples out today or tomorrow.  ? ?Please call the patient back and advise.  ?

## 2021-06-23 ENCOUNTER — Other Ambulatory Visit: Payer: Self-pay

## 2021-06-23 ENCOUNTER — Encounter: Payer: Self-pay | Admitting: Orthopedic Surgery

## 2021-06-23 ENCOUNTER — Ambulatory Visit (INDEPENDENT_AMBULATORY_CARE_PROVIDER_SITE_OTHER): Payer: 59 | Admitting: Orthopedic Surgery

## 2021-06-23 DIAGNOSIS — Z96652 Presence of left artificial knee joint: Secondary | ICD-10-CM

## 2021-06-23 NOTE — Progress Notes (Signed)
FOLLOW UP  ? ?Encounter Diagnosis  ?Name Primary?  ? Status post total left knee replacement Yes  ? ? ? ?Chief Complaint  ?Patient presents with  ? Follow-up  ?   Post-op Follow-up ?   Left tka 05/29/21 ?Here today to have remaining staples harvested.  Staples        removed, incision well healed, c & d, ? ? ? ? ?  ? ? ? ?William all staples were extracted.  No wound issues at this time.  Patient's range of motion is improved to 3-95 degrees ? ?Follow-up in a week continue physical therapy ?

## 2021-06-30 ENCOUNTER — Ambulatory Visit (INDEPENDENT_AMBULATORY_CARE_PROVIDER_SITE_OTHER): Payer: 59 | Admitting: Orthopedic Surgery

## 2021-06-30 ENCOUNTER — Other Ambulatory Visit: Payer: Self-pay

## 2021-06-30 DIAGNOSIS — Z96652 Presence of left artificial knee joint: Secondary | ICD-10-CM

## 2021-06-30 MED ORDER — TRAMADOL HCL 50 MG PO TABS: 50.0000 mg | ORAL_TABLET | Freq: Four times a day (QID) | ORAL | 0 refills | Status: DC | PRN

## 2021-06-30 MED ORDER — METHOCARBAMOL 500 MG PO TABS: 500.0000 mg | ORAL_TABLET | Freq: Four times a day (QID) | ORAL | 0 refills | Status: DC | PRN

## 2021-06-30 NOTE — Progress Notes (Signed)
Chief Complaint  ?Patient presents with  ? Routine Post Op  ?  S/p TKR left knee ?DOS 05/28/21  ? ?Carl Erickson has made some significant improvements in his flexion is up to 115 degrees now he is down to tramadol and Robaxin for pain control ? ?He is at [facility] physical therapy is been there 7 times his measured range of motion is 7-1 15 and he indeed has some flexion issue ? ?I checked his x-ray and he actually had a little hyperextension on postop film so it surprising that he does not have full extension but we will continue with therapy twice a week for 4 weeks we will use some prone hangs at home I will see him back in a month ? ?Meds ordered this encounter  ?Medications  ? traMADol (ULTRAM) 50 MG tablet  ?  Sig: Take 1 tablet (50 mg total) by mouth every 6 (six) hours as needed.  ?  Dispense:  28 tablet  ?  Refill:  0  ? methocarbamol (ROBAXIN) 500 MG tablet  ?  Sig: Take 1 tablet (500 mg total) by mouth every 6 (six) hours as needed for muscle spasms.  ?  Dispense:  56 tablet  ?  Refill:  0  ? ? ?

## 2021-07-30 ENCOUNTER — Ambulatory Visit (INDEPENDENT_AMBULATORY_CARE_PROVIDER_SITE_OTHER): Payer: 59 | Admitting: Orthopedic Surgery

## 2021-07-30 ENCOUNTER — Encounter: Payer: Self-pay | Admitting: Orthopedic Surgery

## 2021-07-30 DIAGNOSIS — Z96652 Presence of left artificial knee joint: Secondary | ICD-10-CM

## 2021-07-30 MED ORDER — METHOCARBAMOL 500 MG PO TABS: 500.0000 mg | ORAL_TABLET | Freq: Four times a day (QID) | ORAL | 0 refills | Status: DC | PRN

## 2021-07-30 NOTE — Progress Notes (Signed)
FOLLOW UP  ? ?Encounter Diagnosis  ?Name Primary?  ? Status post total left knee replacement   ? ? ? ?Chief Complaint  ?Patient presents with  ? Follow-up  ?  DOS 05/28/2021 L TKA  ? ? ? ?Mr. Bhatt comes in postop from a total knee replacement done on May 28, 2021 he is not taking any medication except ibuprofen for pain occasionally he will take a muscle relaxer ? ?He is participating in his physical therapy he says his main problem is he has a feeling that the knee will give out going down the stairs ? ?Is this mid flexion instability? ? ?Otherwise he is doing fine he does have a 5 degree extension deficit but it looks like it is a little bit less than that on exam, a lot of the swelling he had in the knee is gone down.  He can flex the knee 115 degrees. ? ?I am going to refill the muscle relaxer he says that works well for him he can continue with ibuprofen ? ?He will continue with physical therapy.  He is not yet ready to go back to work because it requires a lot of stair climbing. ? ?Exam shows that his incision healed well he just has a little bit of swelling in the joint he is definitely stable in extension he feels stable in flexion ? ?Recommend continued physical therapy follow-up on May 26 reassess the stair issue and the work issue ?

## 2021-07-30 NOTE — Patient Instructions (Addendum)
Follow up on 08/27/21 for post-op s/p L TKA DOS 05/30/21 ? ?OOW note until after next appt. ?

## 2021-08-27 ENCOUNTER — Encounter: Payer: Self-pay | Admitting: Orthopedic Surgery

## 2021-08-27 ENCOUNTER — Ambulatory Visit (INDEPENDENT_AMBULATORY_CARE_PROVIDER_SITE_OTHER): Payer: 59 | Admitting: Orthopedic Surgery

## 2021-08-27 DIAGNOSIS — Z96652 Presence of left artificial knee joint: Secondary | ICD-10-CM

## 2021-08-27 NOTE — Patient Instructions (Signed)
OOW TILL July 10

## 2021-08-27 NOTE — Progress Notes (Signed)
Chief Complaint  Patient presents with   Post-op Follow-up    Left total knee 05/28/21    Carl Erickson returns to [place] after his left total knee 3 months ago.  He has made extensive progress and has completed his physical therapy  He is here with his wife today  His main trouble is that he has difficulty going downstairs.  He goes up the stairs fine.  He can do some yard work.  Because his job requires him to go up and down the stairs this is an issue for him returning to work.  He still has about a 2 to 3 degree   I agree with physical therapy that he can add some weight to the prone hangs continue his strengthening exercises to help his stair descent  We will wait on returning him to work I will see him in about 7 weeks when I return and I have alerted him that [place] will [place] of the [place]

## 2021-09-10 ENCOUNTER — Telehealth: Payer: Self-pay | Admitting: Radiology

## 2021-09-10 NOTE — Telephone Encounter (Signed)
Spoke with patient done.

## 2021-09-10 NOTE — Telephone Encounter (Signed)
Carl Erickson needs last office note sent to them patient states they sent a request over end of May, he wants to know if this can be taken care of so he can get paid through the disability company?

## 2021-10-07 ENCOUNTER — Ambulatory Visit (INDEPENDENT_AMBULATORY_CARE_PROVIDER_SITE_OTHER): Payer: 59 | Admitting: Orthopedic Surgery

## 2021-10-07 ENCOUNTER — Telehealth: Payer: Self-pay | Admitting: Orthopedic Surgery

## 2021-10-07 ENCOUNTER — Encounter: Payer: Self-pay | Admitting: Orthopedic Surgery

## 2021-10-07 DIAGNOSIS — Z96652 Presence of left artificial knee joint: Secondary | ICD-10-CM | POA: Diagnosis not present

## 2021-10-07 MED ORDER — MELOXICAM 7.5 MG PO TABS: 7.5000 mg | ORAL_TABLET | Freq: Every day | ORAL | 5 refills | Status: DC

## 2021-10-07 NOTE — Progress Notes (Signed)
FOLLOW UP   Encounter Diagnosis  Name Primary?   S/P total knee replacement, left 05/28/21 Yes     Chief Complaint  Patient presents with   Post-op Follow-up    Left knee 05/28/21  does he still need Meloxicam? Needs work note return but no stairs    The knee looks good nice and strong   He says hes doing yard work and hes ready to go to work   May return to work but w/ restrictions of No Stairs  Take meloxicam as needed for pain and swelling   Meds ordered this encounter  Medications   meloxicam (MOBIC) 7.5 MG tablet    Sig: Take 1 tablet (7.5 mg total) by mouth daily.    Dispense:  30 tablet    Refill:  5   4 mos fu  Return to work with stair restrictions

## 2021-10-07 NOTE — Telephone Encounter (Signed)
Patient states he spoke with his disability insurer [employer] following his visit today with

## 2021-10-07 NOTE — Patient Instructions (Signed)
May return to work but with restriction: No stairs   Take Meloxicam as needed

## 2022-01-17 ENCOUNTER — Other Ambulatory Visit (HOSPITAL_COMMUNITY): Payer: Self-pay | Admitting: Internal Medicine

## 2022-01-17 DIAGNOSIS — R7989 Other specified abnormal findings of blood chemistry: Secondary | ICD-10-CM

## 2022-02-02 ENCOUNTER — Other Ambulatory Visit (HOSPITAL_COMMUNITY): Payer: 59

## 2022-02-04 ENCOUNTER — Ambulatory Visit (HOSPITAL_COMMUNITY)
Admission: RE | Admit: 2022-02-04 | Discharge: 2022-02-04 | Disposition: A | Discharge status: Home / Self Care | Payer: 59 | Source: Ambulatory Visit | Attending: Internal Medicine | Admitting: Internal Medicine

## 2022-02-04 DIAGNOSIS — R7989 Other specified abnormal findings of blood chemistry: Secondary | ICD-10-CM | POA: Insufficient documentation

## 2022-02-18 ENCOUNTER — Ambulatory Visit (INDEPENDENT_AMBULATORY_CARE_PROVIDER_SITE_OTHER): Payer: 59 | Admitting: Orthopedic Surgery

## 2022-02-18 ENCOUNTER — Encounter: Payer: Self-pay | Admitting: Orthopedic Surgery

## 2022-02-18 DIAGNOSIS — M1712 Unilateral primary osteoarthritis, left knee: Secondary | ICD-10-CM | POA: Diagnosis not present

## 2022-02-18 DIAGNOSIS — Z96652 Presence of left artificial knee joint: Secondary | ICD-10-CM

## 2022-02-18 NOTE — Progress Notes (Signed)
FOLLOW UP   Encounter Diagnoses  Name Primary?   S/P total knee replacement, left 05/28/21 Yes   Primary osteoarthritis of left knee      Chief Complaint  Patient presents with   Post-op Follow-up    Left knee replaced 05/28/21  improving    ( 10/07/21) The knee looks good nice and strong    He says hes doing yard work and hes ready to go to work    May return to work but w/ restrictions of No Stairs   Take meloxicam as needed for pain and swelling.  Today Carl Erickson is doing well.  The only thing that bothers him is when he is at work and has to climb steps and do a lot of walking.  He had some swelling with this but wore his Econ hinged brace and that solved that problem, his preop pain has resolved.  His flexion contracture postop resolved as well.  His flexion arc is 120 degrees.  He walks without support there is no limp.  Return in February for x-rays at 1 year follow-up

## 2022-02-21 ENCOUNTER — Ambulatory Visit: Payer: 59 | Admitting: Orthopedic Surgery

## 2022-05-20 ENCOUNTER — Ambulatory Visit: Payer: 59 | Admitting: Orthopedic Surgery

## 2022-05-25 ENCOUNTER — Ambulatory Visit (INDEPENDENT_AMBULATORY_CARE_PROVIDER_SITE_OTHER): Payer: No Typology Code available for payment source

## 2022-05-25 ENCOUNTER — Ambulatory Visit (INDEPENDENT_AMBULATORY_CARE_PROVIDER_SITE_OTHER): Payer: No Typology Code available for payment source | Admitting: Orthopedic Surgery

## 2022-05-25 ENCOUNTER — Encounter: Payer: Self-pay | Admitting: Orthopedic Surgery

## 2022-05-25 DIAGNOSIS — M1712 Unilateral primary osteoarthritis, left knee: Secondary | ICD-10-CM

## 2022-05-25 DIAGNOSIS — Z96652 Presence of left artificial knee joint: Secondary | ICD-10-CM

## 2022-05-25 NOTE — Progress Notes (Signed)
Chief Complaint  Patient presents with   Post-op Follow-up    Left knee 05/28/21     1 yr post op   This is a 1 year postop visit for Carl Erickson he was able to return to work at [employer] he is happy with his knee  He has no complaints  He just got off of work he works third shift  The 1 year postop x-ray shows that the sizing is good there is no loosening alignment is good  Range of motion is 0-1 25 he is doing very well walks without support no limp  Recommend follow-up in 2 years for x-ray

## 2022-06-02 ENCOUNTER — Encounter: Payer: Self-pay | Admitting: Radiology

## 2023-01-08 IMAGING — MR MR KNEE*L* W/O CM
9 series · 40 of 40 positions shown · non-contrast
Comparison: X-ray knee 03/18/2021.

CLINICAL DATA: Left knee pain for 8 months related to playing golf.
Patient reports pain increases during climbing. Meniscal injury,
knee.

EXAM:
MRI OF THE LEFT KNEE WITHOUT CONTRAST
TECHNIQUE: Multiplanar, multisequence MR imaging of the knee was performed. No
intravenous contrast was administered.

[Series 8: T2 fat-sat · axial · left · 4.0mm · 0.47mm/px · z∈[-88,+36]mm · 5 of 26 slices shown (1 of 4)]
[im 1/26]
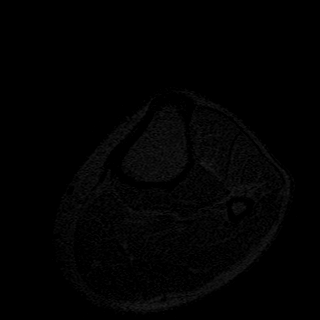
[im 7/26]
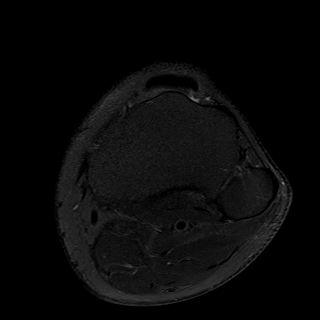
[im 13/26]
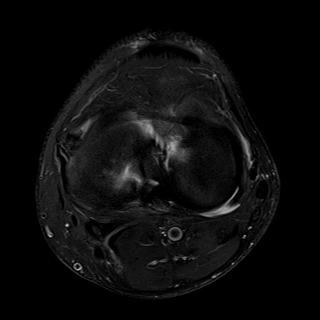
[im 19/26]
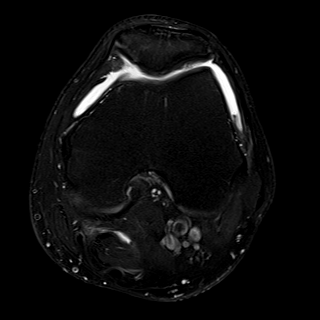
[im 26/26]
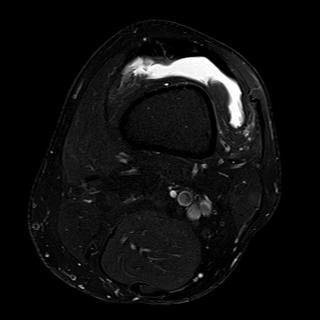

[Series 9: T1 · coronal · left · 4.0mm · 0.59mm/px · 4 of 24 slices shown]
[im 1/24]
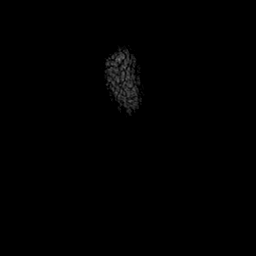
[im 8/24]
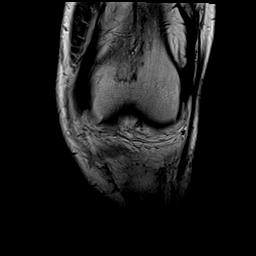
[im 16/24]
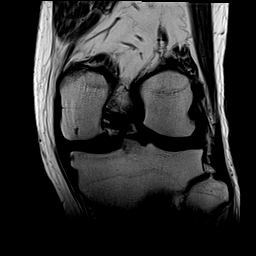
[im 24/24]
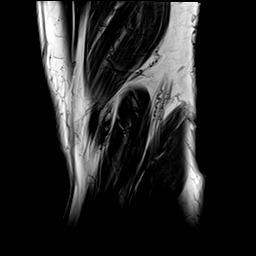

[Series 10: T2 fat-sat · coronal · left · 4.0mm · 0.59mm/px · 4 of 27 slices shown (2 of 4)]
[im 1/27]
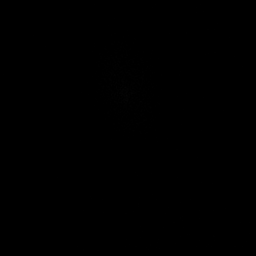
[im 9/27]
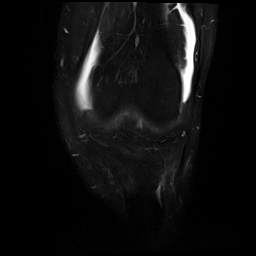
[im 18/27]
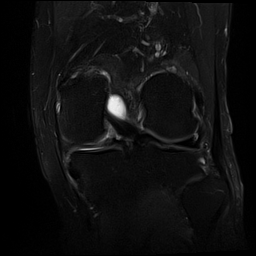
[im 27/27]
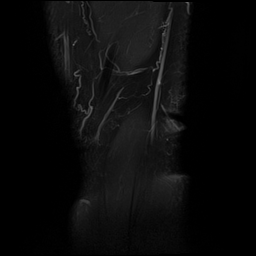

[Series 11: PD fat-sat · coronal · left · 4.0mm · 0.59mm/px · 4 of 28 slices shown (1 of 3)]
[im 1/28]
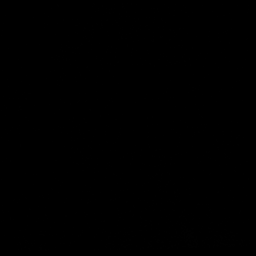
[im 10/28]
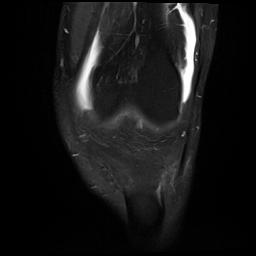
[im 19/28]
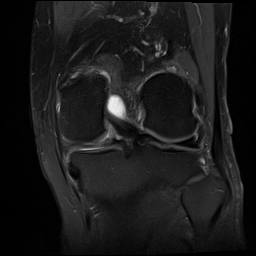
[im 28/28]
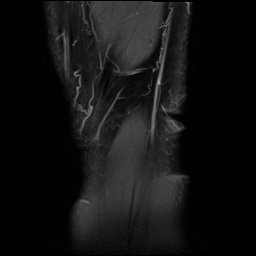

[Series 12: PD fat-sat · sagittal · left · 3.0mm · 0.52mm/px · 5 of 32 slices shown (2 of 3)]
[im 1/32]
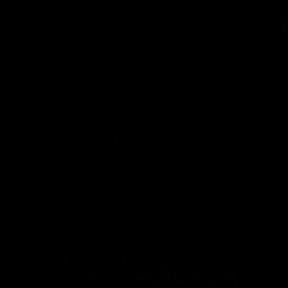
[im 8/32]
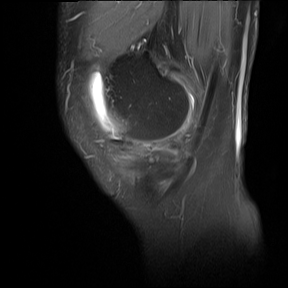
[im 16/32]
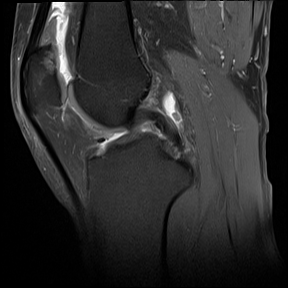
[im 24/32]
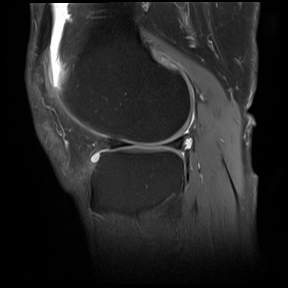
[im 32/32]
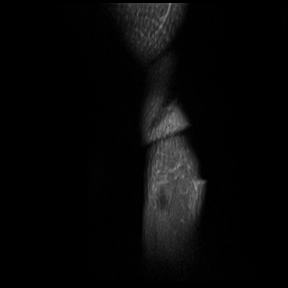

[Series 13: T2 fat-sat · sagittal · left · 3.0mm · 0.59mm/px · 5 of 32 slices shown (3 of 4)]
[im 1/32]
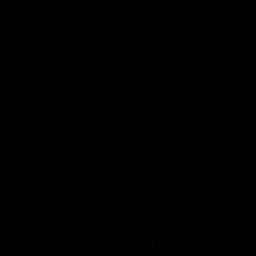
[im 8/32]
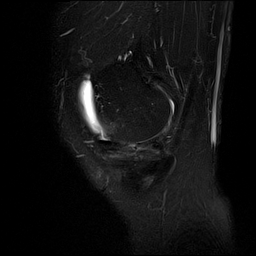
[im 16/32]
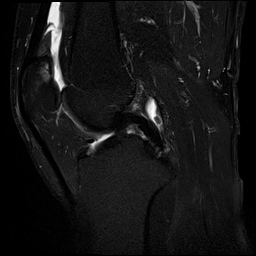
[im 24/32]
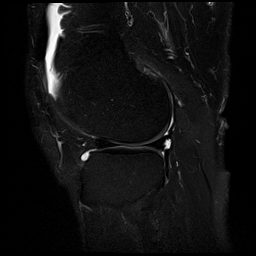
[im 32/32]
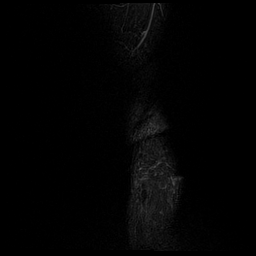

[Series 14: PD fat-sat · sagittal · left · 3.0mm · 0.52mm/px · 5 of 32 slices shown (3 of 3)]
[im 1/32]
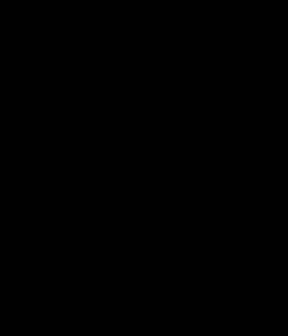
[im 8/32]
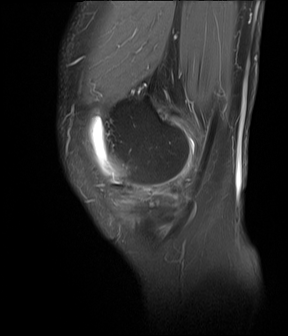
[im 16/32]
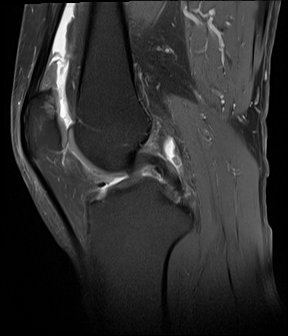
[im 24/32]
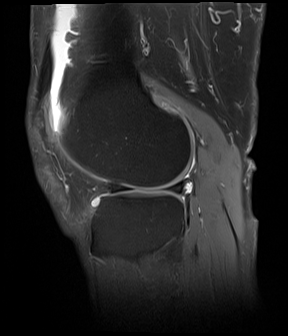
[im 32/32]
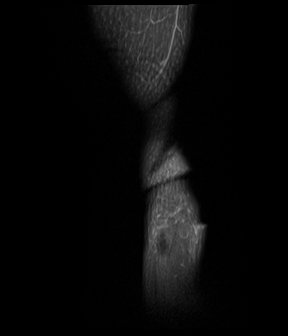

[Series 15: T2 fat-sat · sagittal · left · 3.0mm · 0.59mm/px · 5 of 32 slices shown (4 of 4)]
[im 1/32]
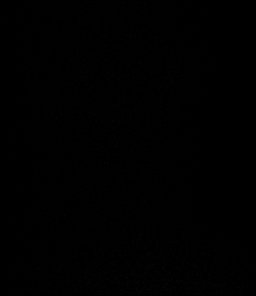
[im 8/32]
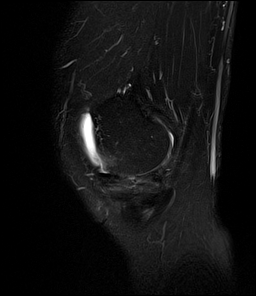
[im 16/32]
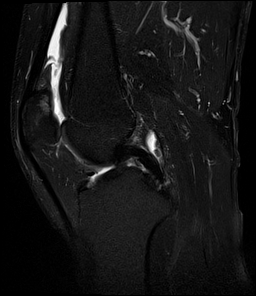
[im 24/32]
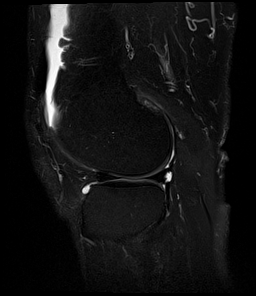
[im 32/32]
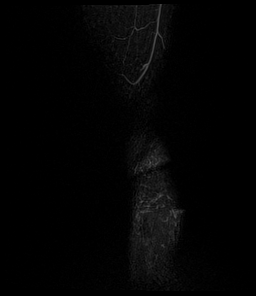

[Series 16: PD · oblique · left · 2.0mm · 0.47mm/px · 3 of 16 slices shown]
[im 1/16]
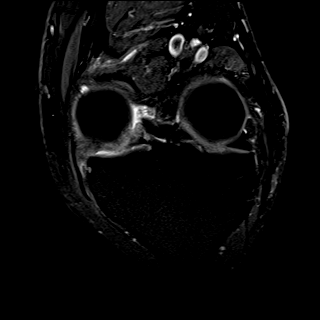
[im 8/16]
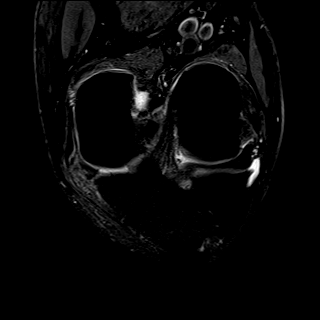
[im 16/16]
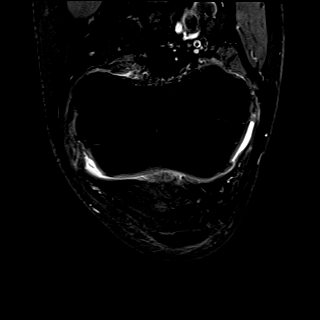

[40 of 40 positions shown; findings below may reference images not displayed]

FINDINGS: MENISCI

Medial: Complex degenerative tearing of the posterior horn and body
of the medial meniscus. Mild extrusion.

Lateral: Intact.

LIGAMENTS

Cruciates: ACL and PCL are intact.

Collaterals: Mild bowing of the medial collateral ligament due to
extruded meniscus. The MCL is otherwise intact. Lateral collateral
ligament complex is intact.

CARTILAGE

Patellofemoral: Partial-thickness cartilage loss with intermediate
and high-grade chondral fissuring along the medial and lateral
patellar facets and underlying marrow edema.

Medial: High-grade, near full-thickness cartilage loss along the
weight-bearing surfaces.

Lateral: Shallow chondral fissuring along the lateral tibial
plateau.

JOINT: Moderate-sized joint effusion.

POPLITEAL FOSSA: Small Baker's cyst.

EXTENSOR MECHANISM: Intact quadriceps tendon. Intact patellar
tendon.

BONES: Tricompartment osteophyte formation. No acute fracture or
dislocation. No aggressive osseous lesion.

Other: No additional findings.
IMPRESSION: 1. Complex degenerative tearing of the posterior horn and body of
the medial meniscus with mild extrusion.
2. Tricompartment osteoarthritis, worst in the medial compartment,
cartilaginous abnormalities as described above.
3. Moderate-sized joint effusion.  Small Baker's cyst.

## 2023-03-01 IMAGING — DX DG KNEE 1-2V PORT*L*
2 series · 2 of 2 positions shown · non-contrast
Comparison: Left knee MRI 04/06/2021

CLINICAL DATA: Postop total knee replacement

EXAM:
PORTABLE LEFT KNEE - 1-2 VIEW

[knee ap]
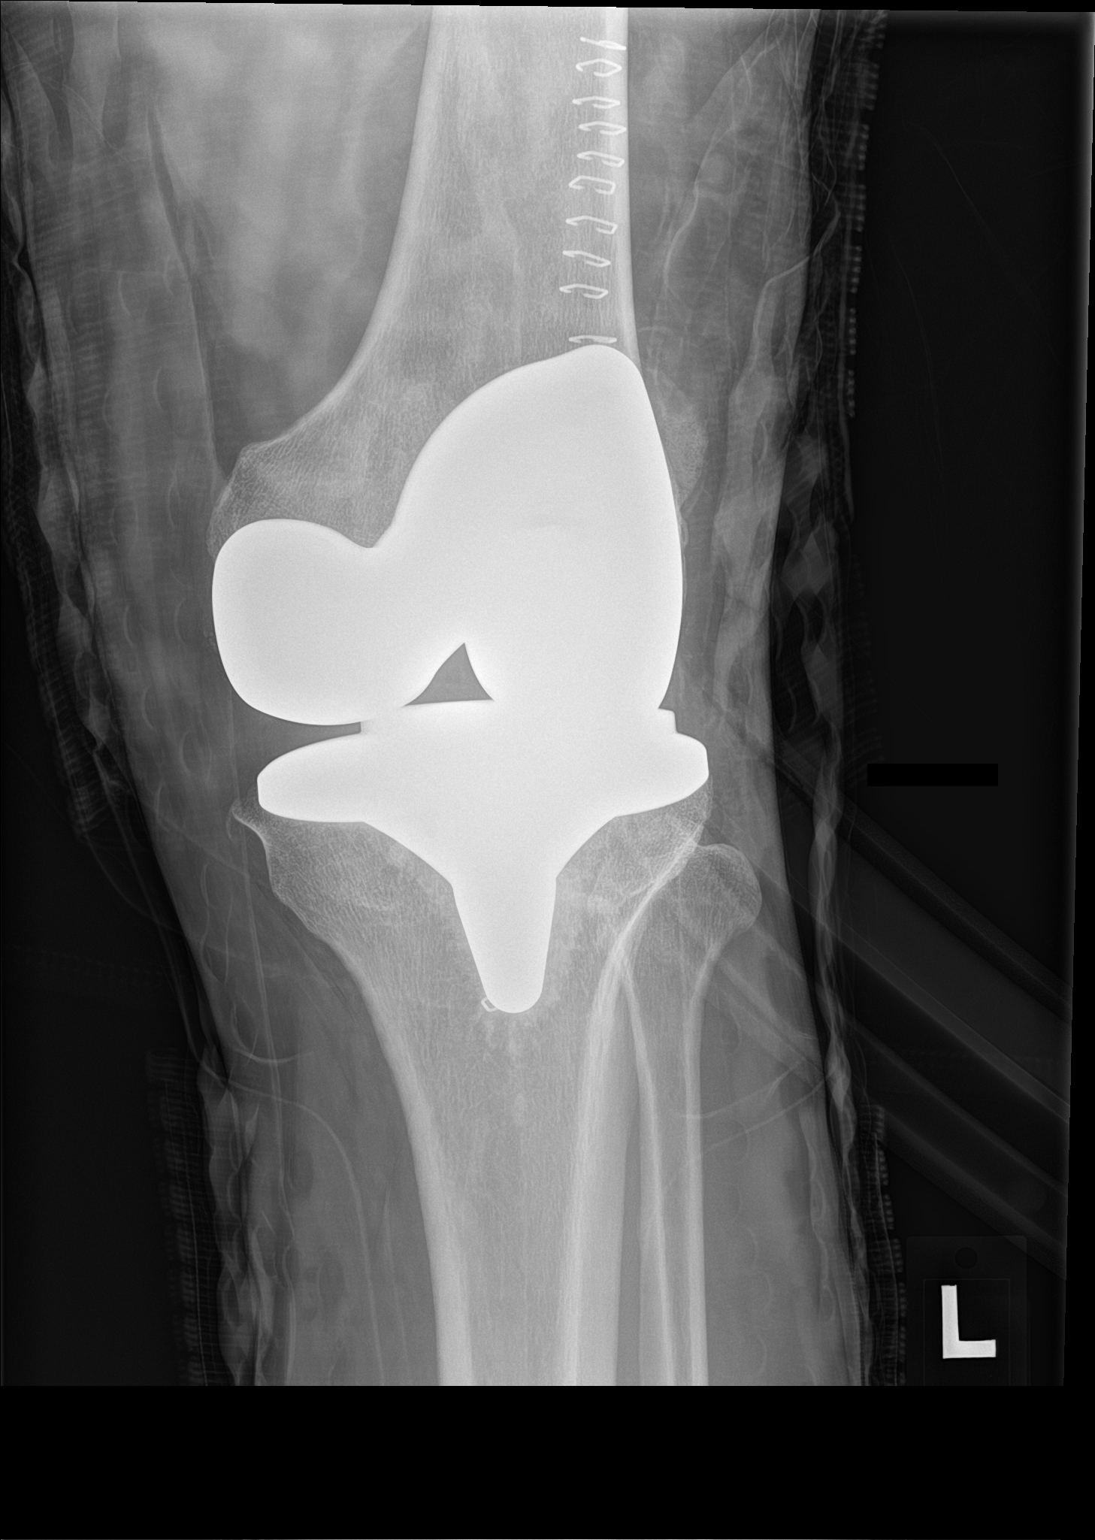

[knee lat]
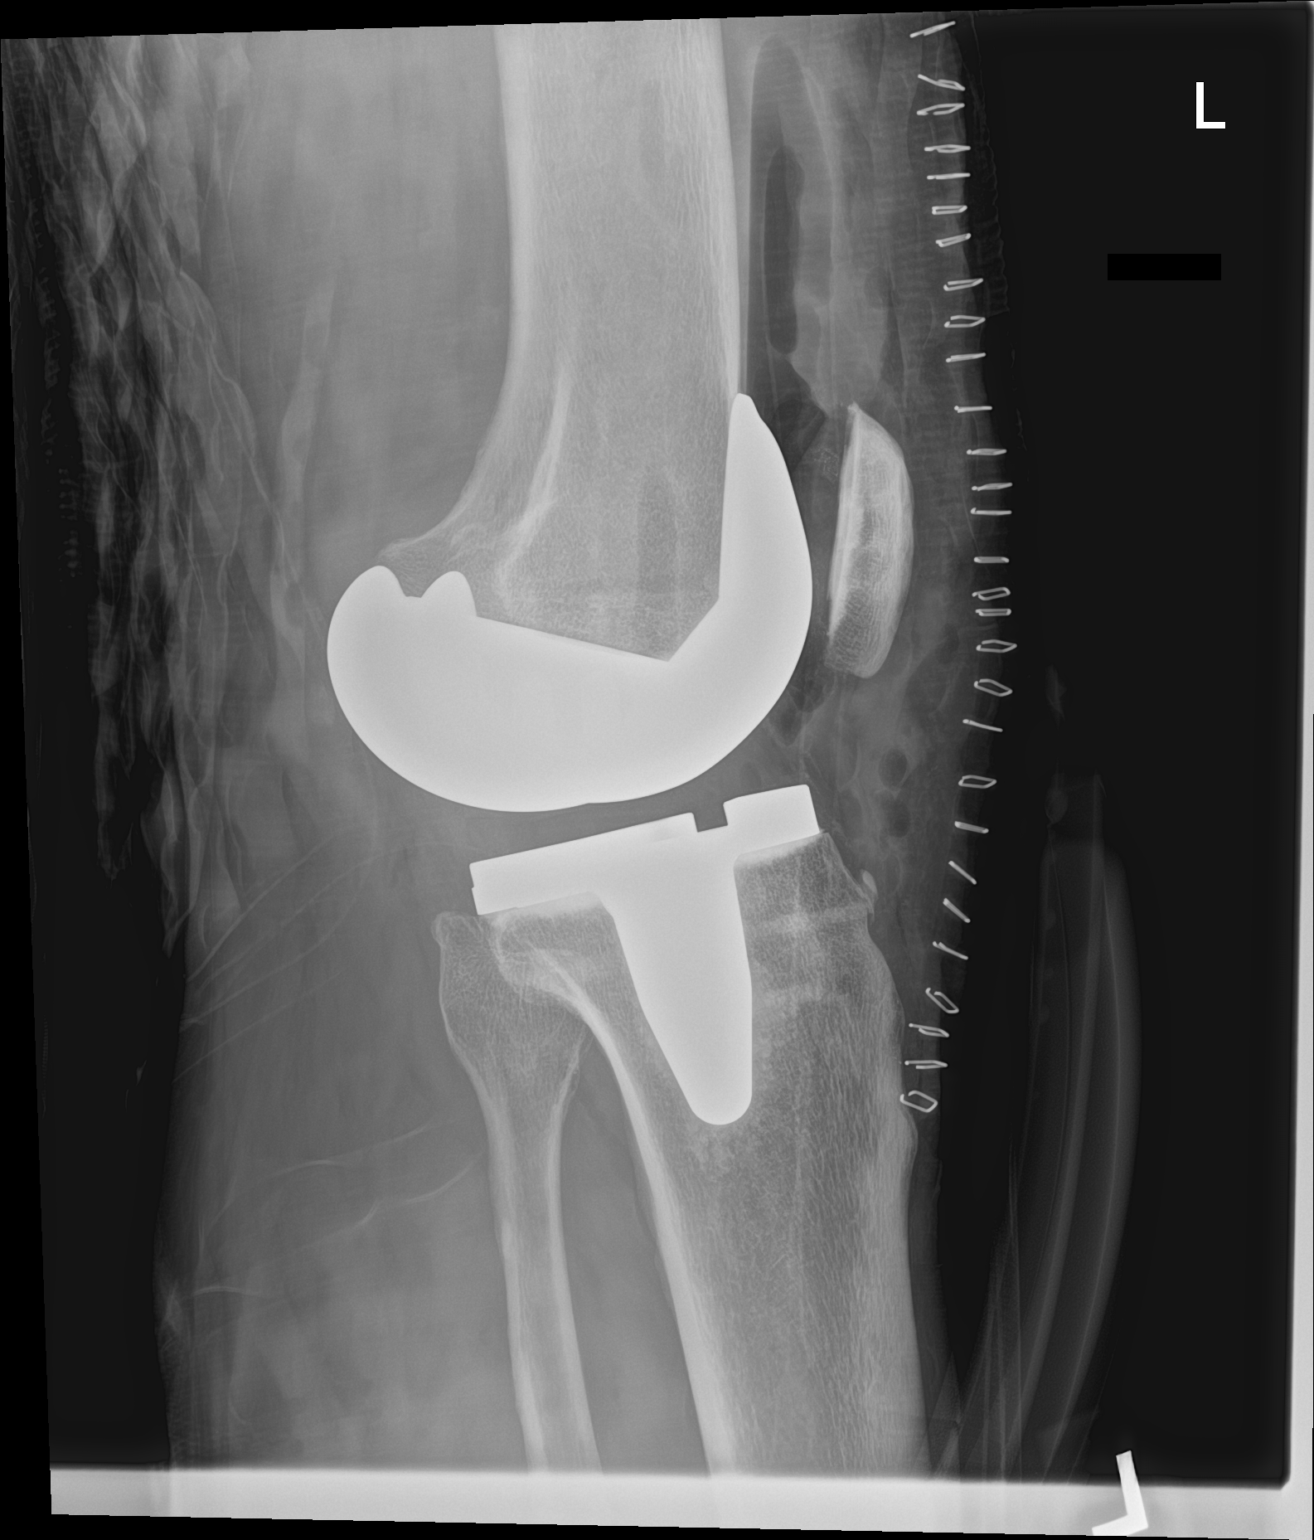

[2 of 2 positions shown; findings below may reference images not displayed]

FINDINGS: Three component total knee arthroplasty normal alignment without
evidence of loosening or periprosthetic fracture. Expected soft
tissue changes including a joint effusion with intra-articular gas.
IMPRESSION: Left total knee arthroplasty without evidence of immediate hardware
complication.

## 2023-06-22 ENCOUNTER — Encounter: Payer: Self-pay | Admitting: *Deleted

## 2023-07-05 ENCOUNTER — Telehealth: Payer: Self-pay | Admitting: *Deleted

## 2023-07-05 NOTE — Telephone Encounter (Signed)
  Procedure: colonoscopy  Height: 5'10" Weight: 195 lb        Have you had a colonoscopy before?  Yes, 09/19/14,Dr.Jenkins  Do you have family history of colon cancer?  Yes, grandfather  Do you have a family history of polyps? yes  Previous colonoscopy with polyps removed? yes  Do you have a history colorectal cancer?   no  Are you diabetic?  no  Do you have a prosthetic or mechanical heart valve? no  Do you have a pacemaker/defibrillator?   no  Have you had endocarditis/atrial fibrillation?  no  Do you use supplemental oxygen/CPAP?  no  Have you had joint replacement within the last 12 months?  no  Do you tend to be constipated or have to use laxatives?  no   Do you have history of alcohol use? If yes, how much and how often.  Yes, 4 beers daily  Do you have history or are you using drugs? If yes, what do are you  using?  no  Have you ever had a stroke/heart attack?  no  Have you ever had a heart or other vascular stent placed,?no  Do you take weight loss medication? no  Do you take any blood-thinning medications such as: (Plavix, aspirin, Coumadin, Aggrenox, Brilinta, Xarelto, Eliquis, Pradaxa, Savaysa or Effient)? No  If yes we need the name, milligram, dosage and who is prescribing doctor:  n/a             Current Outpatient Medications  Medication Sig Dispense Refill   ALPRAZolam (XANAX) 1 MG tablet Take 0.5-1 mg by mouth 2 (two) times daily as needed for anxiety.     amLODipine (NORVASC) 10 MG tablet Take 10 mg by mouth daily.     hydrochlorothiazide (HYDRODIURIL) 12.5 MG tablet Take 12.5 mg by mouth daily.     labetalol (NORMODYNE) 100 MG tablet Take 100 mg by mouth 2 (two) times daily.     Multiple Vitamins-Minerals (CENTRUM ULTRA MENS PO) Take 1 tablet by mouth daily.     rosuvastatin (CRESTOR) 20 MG tablet Take 20 mg by mouth daily.     Turmeric (QC TUMERIC COMPLEX PO) Take 1,000 mg by mouth daily.     meloxicam (MOBIC) 7.5 MG tablet Take 1 tablet (7.5 mg  total) by mouth daily. (Patient not taking: Reported on 07/05/2023) 30 tablet 5   methocarbamol (ROBAXIN) 500 MG tablet Take 1 tablet (500 mg total) by mouth every 6 (six) hours as needed for muscle spasms. (Patient not taking: Reported on 07/05/2023) 56 tablet 0   potassium chloride SA (KLOR-CON M) 20 MEQ tablet Take 1 tablet (20 mEq total) by mouth 3 (three) times daily. (Patient not taking: Reported on 07/05/2023) 6 tablet 0   Current Facility-Administered Medications  Medication Dose Route Frequency Provider Last Rate Last Admin   bupivacaine-meloxicam ER (ZYNRELEF) injection 400 mg  400 mg Infiltration Once Vickki Hearing, MD        Allergies  Allergen Reactions   Ace Inhibitors Anaphylaxis

## 2023-08-07 MED ORDER — PEG 3350-KCL-NA BICARB-NACL 420 G PO SOLR: 4000.0000 mL | Freq: Once | ORAL | 0 refills | Status: AC

## 2023-08-07 NOTE — Telephone Encounter (Addendum)
 Spoke with pt. Has been scheduled for 5/28 with Dr. Riley Cheadle. Aware will send instructions to him. Rx for prep to be sent to [facility]   "Notification or Prior Authorization is not required for the requested services You are not required to submit a notification/prior authorization based on the information provided. The number above acknowledges your inquiry and our response. Please reference this number for future inquiries. Notification is not a guarantee of coverage or payment. Questions should be directed to [email or web address] > Eligibility or [phone]. Decision ID #: [id number]"

## 2023-08-07 NOTE — Telephone Encounter (Signed)
 OK to schedule.  ASA 2 possibly 3 due to ETOH, but on to schedule in room for ASA 1/2.  Will need BMP.

## 2023-08-07 NOTE — Addendum Note (Signed)
 Addended by: Feliz Hosteller on: 08/07/2023 04:41 PM   Modules accepted: Orders

## 2023-08-08 ENCOUNTER — Encounter (INDEPENDENT_AMBULATORY_CARE_PROVIDER_SITE_OTHER): Payer: Self-pay | Admitting: *Deleted

## 2023-08-08 NOTE — Telephone Encounter (Signed)
 Referral completed, TCS apt letter sent to PCP

## 2023-08-30 ENCOUNTER — Other Ambulatory Visit: Payer: Self-pay

## 2023-08-30 ENCOUNTER — Ambulatory Visit (HOSPITAL_COMMUNITY): Admitting: Anesthesiology

## 2023-08-30 ENCOUNTER — Ambulatory Visit (HOSPITAL_COMMUNITY)
Admission: RE | Admit: 2023-08-30 | Discharge: 2023-08-30 | Disposition: A | Discharge status: Home / Self Care | Attending: Internal Medicine | Admitting: Internal Medicine

## 2023-08-30 ENCOUNTER — Encounter (HOSPITAL_COMMUNITY): Payer: Self-pay | Admitting: Internal Medicine

## 2023-08-30 ENCOUNTER — Encounter (HOSPITAL_COMMUNITY)
Admission: RE | Disposition: A | Discharge status: Home / Self Care | Payer: Self-pay | Source: Home / Self Care | Attending: Internal Medicine

## 2023-08-30 DIAGNOSIS — Z1211 Encounter for screening for malignant neoplasm of colon: Secondary | ICD-10-CM | POA: Diagnosis present

## 2023-08-30 DIAGNOSIS — F419 Anxiety disorder, unspecified: Secondary | ICD-10-CM | POA: Diagnosis not present

## 2023-08-30 DIAGNOSIS — K5730 Diverticulosis of large intestine without perforation or abscess without bleeding: Secondary | ICD-10-CM | POA: Insufficient documentation

## 2023-08-30 DIAGNOSIS — I10 Essential (primary) hypertension: Secondary | ICD-10-CM | POA: Insufficient documentation

## 2023-08-30 DIAGNOSIS — D124 Benign neoplasm of descending colon: Secondary | ICD-10-CM | POA: Diagnosis not present

## 2023-08-30 DIAGNOSIS — K649 Unspecified hemorrhoids: Secondary | ICD-10-CM | POA: Diagnosis not present

## 2023-08-30 HISTORY — DX: Unspecified osteoarthritis, unspecified site: M19.90

## 2023-08-30 HISTORY — PX: COLONOSCOPY: SHX5424

## 2023-08-30 SURGERY — COLONOSCOPY
Anesthesia: General

## 2023-08-30 MED ORDER — PROPOFOL 500 MG/50ML IV EMUL
INTRAVENOUS | Status: DC | PRN
  Administered 2023-08-30: 200 ug/kg/min via INTRAVENOUS

## 2023-08-30 MED ORDER — LACTATED RINGERS IV SOLN: INTRAVENOUS | Status: DC | PRN

## 2023-08-30 MED ORDER — PROPOFOL 10 MG/ML IV BOLUS
INTRAVENOUS | Status: DC | PRN
  Administered 2023-08-30: 100 mg via INTRAVENOUS

## 2023-08-30 NOTE — Op Note (Signed)
 [facility] Patient Name: Carl Erickson Procedure Date: 08/30/2023 9:23 AM MRN: [id number] Date of Birth: 1960/08/19 Attending MD: Gemma Kelp , MD, [id number] CSN: [id number] Age: 63 Admit Type: Outpatient Procedure:                Colonoscopy Indications:              High risk colon cancer surveillance: Personal                            history of colonic polyps Providers:                Gemma Kelp, MD, Willena Harp,                            Jolee Naval, Technician Referring MD:              Medicines:                Propofol  per Anesthesia Complications:            No immediate complications. Estimated Blood Loss:     Estimated blood loss was minimal. Procedure:                Pre-Anesthesia Assessment:                           - Prior to the procedure, a History and Physical                            was performed, and patient medications and                            allergies were reviewed. The patient's tolerance of                            previous anesthesia was also reviewed. The risks                            and benefits of the procedure and the sedation                            options and risks were discussed with the patient.                            All questions were answered, and informed consent                            was obtained. Prior Anticoagulants: The patient has                            taken no anticoagulant or antiplatelet agents. ASA                            Grade Assessment: II - A patient with mild systemic  disease. After reviewing the risks and benefits,                            the patient was deemed in satisfactory condition to                            undergo the procedure.                           After obtaining informed consent, the colonoscope                            was passed under direct vision. Throughout the                            procedure, the  patient's blood pressure, pulse, and                            oxygen saturations were monitored continuously. The                            [id number]) scope was introduced through the                            anus and advanced to the the cecum, identified by                            appendiceal orifice and ileocecal valve. The                            colonoscopy was performed without difficulty. The                            patient tolerated the procedure well. The quality                            of the bowel preparation was adequate. The entire                            colon was well visualized. Scope In: 9:39:20 AM Scope Out: 9:53:25 AM Total Procedure Duration: 0 hours 14 minutes 5 seconds  Findings:      The perianal and digital rectal examinations were normal.      Scattered medium-mouthed diverticula were found in the sigmoid colon and       descending colon.      Three semi-pedunculated polyps were found in the proximal descending       colon. The polyps were 4 to 7 mm in size. These polyps were removed with       a cold snare. Resection and retrieval were complete. Estimated blood       loss was minimal.      The exam was otherwise without abnormality on direct and retroflexion       views. Impression:               - Diverticulosis in the sigmoid colon and  in the                            descending colon.                           - Three 4 to 7 mm polyps in the proximal descending                            colon, removed with a cold snare. Resected and                            retrieved.                           - The examination was otherwise normal on direct                            and retroflexion views. Moderate Sedation:      Moderate (conscious) sedation was personally administered by an       anesthesia professional. The following parameters were monitored: oxygen       saturation, heart rate, blood pressure, respiratory rate, EKG,  adequacy       of pulmonary ventilation, and response to care. Recommendation:           - Patient has a contact number available for                            emergencies. The signs and symptoms of potential                            delayed complications were discussed with the                            patient. Return to normal activities tomorrow.                            Written discharge instructions were provided to the                            patient.                           - Advance diet as tolerated.                           - Continue present medications.                           - Repeat colonoscopy date to be determined after                            pending pathology results are reviewed for                            surveillance based on pathology results.                           -  Return to GI office (date not yet determined). Procedure Code(s):        --- Professional ---                           [phone], Colonoscopy, flexible; with removal of                            tumor(s), polyp(s), or other lesion(s) by snare                            technique Diagnosis Code(s):        --- Professional ---                           Z86.010, Personal history of colonic polyps                           D12.4, Benign neoplasm of descending colon                           K57.30, Diverticulosis of large intestine without                            perforation or abscess without bleeding CPT copyright 2022 American Medical Association. All rights reserved. The codes documented in this report are preliminary and upon coder review may  be revised to meet current compliance requirements. Windsor Hatcher. Nashea Chumney, MD Gemma Kelp, MD 08/30/2023 10:00:45 AM This report has been signed electronically. Number of Addenda: 0

## 2023-08-30 NOTE — Transfer of Care (Signed)
 Immediate Anesthesia Transfer of Care Note  Patient: Carl Erickson  Procedure(s) Performed: COLONOSCOPY  Patient Location: Endoscopy Unit  Anesthesia Type:General  Level of Consciousness: awake, alert , oriented, and patient cooperative  Airway & Oxygen Therapy: Patient Spontanous Breathing  Post-op Assessment: Report given to RN, Post -op Vital signs reviewed and stable, and Patient moving all extremities X 4  Post vital signs: Reviewed and stable  Last Vitals:  Vitals Value Taken Time  BP 94/68 08/30/23 0957  Temp 36.5 C 08/30/23 0957  Pulse 74 08/30/23 0957  Resp 16 08/30/23 0957  SpO2 94 % 08/30/23 0957    Last Pain:  Vitals:   08/30/23 0957  TempSrc: Oral  PainSc: 0-No pain      Patients Stated Pain Goal: 10 (08/30/23 0844)  Complications: No notable events documented.

## 2023-08-30 NOTE — H&P (Signed)
 @LOGO @   Primary Care Physician:  Kathyleen Parkins, MD Primary Gastroenterologist:  Dr. Riley Cheadle  Pre-Procedure History & Physical: HPI:  Carl Erickson is a 63 y.o. male here for  a surveillance colonoscopy.  (2) subcentimeter adenomas removed 2016.  No bowel symptoms at this time.  Past Medical History:  Diagnosis Date   Anxiety    Arthritis    Chest pain    Essential hypertension    Hyperlipidemia     Past Surgical History:  Procedure Laterality Date   COLONOSCOPY N/A 09/09/2014   Procedure: COLONOSCOPY;  Surgeon: Alanda Allegra Md, MD;  Location: [facility] SUITE;  Service: Gastroenterology;  Laterality: N/A;   Repair on Right Hand     TOTAL KNEE ARTHROPLASTY Left 05/28/2021   Procedure: TOTAL KNEE ARTHROPLASTY;  Surgeon: Darrin Emerald, MD;  Location: [facility] ORS;  Service: Orthopedics;  Laterality: Left;    Prior to Admission medications   Medication Sig Start Date End Date Taking? Authorizing Provider  ALPRAZolam  (XANAX ) 1 MG tablet Take 0.5-1 mg by mouth 2 (two) times daily as needed for anxiety.   Yes [provider]  amLODipine  (NORVASC ) 10 MG tablet Take 10 mg by mouth daily. 12/28/20  Yes [provider]  labetalol  (NORMODYNE ) 100 MG tablet Take 100 mg by mouth 2 (two) times daily. 03/01/21  Yes [provider]  Multiple Vitamins-Minerals (CENTRUM ULTRA MENS PO) Take 1 tablet by mouth daily.   Yes [provider]  rosuvastatin  (CRESTOR ) 20 MG tablet Take 20 mg by mouth daily. 01/29/21  Yes [provider]  Turmeric (QC TUMERIC COMPLEX PO) Take 1,000 mg by mouth daily.   Yes [provider]    Allergies as of 08/07/2023 - Review Complete 07/05/2023  Allergen Reaction Noted   Ace inhibitors Anaphylaxis 07/05/2017    Family History  Problem Relation Age of Onset   Asthma Mother    Stroke Mother    COPD Father     Social History   Socioeconomic History   Marital status: Married    Spouse name: Not on file    Number of children: Not on file   Years of education: Not on file   Highest education level: Not on file  Occupational History   Not on file  Tobacco Use   Smoking status: Never   Smokeless tobacco: Never  Vaping Use   Vaping status: Never Used  Substance and Sexual Activity   Alcohol use: Yes    Comment: 3-6 beers daily   Drug use: Not Currently   Sexual activity: Not on file  Other Topics Concern   Not on file  Social History Narrative   Not on file   Social Drivers of Health   Financial Resource Strain: Not on file  Food Insecurity: Not on file  Transportation Needs: Not on file  Physical Activity: Not on file  Stress: Not on file  Social Connections: Not on file  Intimate Partner Violence: Not on file    Review of Systems: See HPI, otherwise negative ROS  Physical Exam: BP 114/79   Pulse 83   Temp 97.9 F (36.6 C) (Oral)   Resp 13   Ht 5\' 10"  (1.778 m)   Wt 86.6 kg   SpO2 96%   BMI 27.41 kg/m  General:   Alert,  Well-developed, well-nourished, pleasant and cooperative in NAD Neck:  Supple; no masses or thyromegaly. No significant cervical adenopathy. Lungs:  Clear throughout to auscultation.   No wheezes, crackles, or rhonchi.  No acute distress. Heart:  Regular rate and rhythm; no murmurs, clicks, rubs,  or gallops. Abdomen: Non-distended, normal bowel sounds.  Soft and nontender without appreciable mass or hepatosplenomegaly.   Impression/Plan:    63 year old gentleman with a history of colonic adenomas.  He he is here for surveillance colonoscopy per plan.  The risks, benefits, limitations, alternatives and imponderables have been reviewed with the patient. The risks, benefits, limitations, alternatives and imponderables have been reviewed with the patient. Questions have been answered. All parties are agreeable.      Notice: This dictation was prepared with Dragon dictation along with smaller phrase technology. Any transcriptional errors that result from  this process are unintentional and may not be corrected upon review.

## 2023-08-30 NOTE — Anesthesia Preprocedure Evaluation (Signed)
 Anesthesia Evaluation  Patient identified by MRN, date of birth, ID band Patient awake    Reviewed: Allergy & Precautions, H&P , NPO status , Patient's Chart, lab work & pertinent test results, reviewed documented beta blocker date and time   Airway Mallampati: II  TM Distance: >3 FB Neck ROM: full    Dental no notable dental hx.    Pulmonary neg pulmonary ROS   Pulmonary exam normal breath sounds clear to auscultation       Cardiovascular Exercise Tolerance: Good hypertension,  Rhythm:regular Rate:Normal     Neuro/Psych   Anxiety     negative neurological ROS  negative psych ROS   GI/Hepatic negative GI ROS, Neg liver ROS,,,  Endo/Other  negative endocrine ROS    Renal/GU negative Renal ROS  negative genitourinary   Musculoskeletal   Abdominal   Peds  Hematology negative hematology ROS (+)   Anesthesia Other Findings   Reproductive/Obstetrics negative OB ROS                             Anesthesia Physical Anesthesia Plan  ASA: 2  Anesthesia Plan: General   Post-op Pain Management:    Induction:   PONV Risk Score and Plan: Propofol  infusion  Airway Management Planned:   Additional Equipment:   Intra-op Plan:   Post-operative Plan:   Informed Consent: I have reviewed the patients History and Physical, chart, labs and discussed the procedure including the risks, benefits and alternatives for the proposed anesthesia with the patient or authorized representative who has indicated his/her understanding and acceptance.     Dental Advisory Given  Plan Discussed with: CRNA  Anesthesia Plan Comments:        Anesthesia Quick Evaluation

## 2023-08-30 NOTE — Discharge Instructions (Addendum)
  Colonoscopy Discharge Instructions  Read the instructions outlined below and refer to this sheet in the next few weeks. These discharge instructions provide you with general information on caring for yourself after you leave the hospital. Your doctor may also give you specific instructions. While your treatment has been planned according to the most current medical practices available, unavoidable complications occasionally occur. If you have any problems or questions after discharge, call Dr. Riley Cheadle at [phone]. ACTIVITY You may resume your regular activity, but move at a slower pace for the next 24 hours.  Take frequent rest periods for the next 24 hours.  Walking will help get rid of the air and reduce the bloated feeling in your belly (abdomen).  No driving for 24 hours (because of the medicine (anesthesia) used during the test).   Do not sign any important legal documents or operate any machinery for 24 hours (because of the anesthesia used during the test).  NUTRITION Drink plenty of fluids.  You may resume your normal diet as instructed by your doctor.  Begin with a light meal and progress to your normal diet. Heavy or fried foods are harder to digest and may make you feel sick to your stomach (nauseated).  Avoid alcoholic beverages for 24 hours or as instructed.  MEDICATIONS You may resume your normal medications unless your doctor tells you otherwise.  WHAT YOU CAN EXPECT TODAY Some feelings of bloating in the abdomen.  Passage of more gas than usual.  Spotting of blood in your stool or on the toilet paper.  IF YOU HAD POLYPS REMOVED DURING THE COLONOSCOPY: No aspirin  products for 7 days or as instructed.  No alcohol for 7 days or as instructed.  Eat a soft diet for the next 24 hours.  FINDING OUT THE RESULTS OF YOUR TEST Not all test results are available during your visit. If your test results are not back during the visit, make an appointment with your caregiver to find out the  results. Do not assume everything is normal if you have not heard from your caregiver or the medical facility. It is important for you to follow up on all of your test results.  SEEK IMMEDIATE MEDICAL ATTENTION IF: You have more than a spotting of blood in your stool.  Your belly is swollen (abdominal distention).  You are nauseated or vomiting.  You have a temperature over 101.  You have abdominal pain or discomfort that is severe or gets worse throughout the day.      Diverticulosis and 3 polyps found and removed     Information on diverticulosis and colon polyps provided    you may  or may not pass a small amount of blood during your next bowel movement or 2 but it should resolve on its own   further recommendations to follow pending review of pending

## 2023-08-31 ENCOUNTER — Encounter (HOSPITAL_COMMUNITY): Payer: Self-pay | Admitting: Internal Medicine

## 2023-09-01 LAB — SURGICAL PATHOLOGY

## 2023-09-01 NOTE — Anesthesia Postprocedure Evaluation (Signed)
 Anesthesia Post Note  Patient: Carl Erickson  Procedure(s) Performed: COLONOSCOPY  Patient location during evaluation: Phase II Anesthesia Type: General Level of consciousness: awake Pain management: pain level controlled Vital Signs Assessment: post-procedure vital signs reviewed and stable Respiratory status: spontaneous breathing and respiratory function stable Cardiovascular status: blood pressure returned to baseline and stable Postop Assessment: no headache and no apparent nausea or vomiting Anesthetic complications: no Comments: Late entry   No notable events documented.   Last Vitals:  Vitals:   08/30/23 0844 08/30/23 0957  BP: 114/79 94/68  Pulse: 83 74  Resp: 13 16  Temp: 36.6 C 36.5 C  SpO2: 96% 94%    Last Pain:  Vitals:   08/30/23 0957  TempSrc: Oral  PainSc: 0-No pain                 Coretha Dew

## 2023-09-04 ENCOUNTER — Ambulatory Visit: Payer: Self-pay | Admitting: Internal Medicine

## 2024-05-27 ENCOUNTER — Ambulatory Visit: Payer: No Typology Code available for payment source | Admitting: Orthopedic Surgery
